# Patient Record
Sex: Female | Born: 1950 | Race: Black or African American | Hispanic: No | Smoking: Current every day smoker
Health system: Southern US, Community
[De-identification: ages and names within clinical notes are randomized; demographics above are authoritative.]

## PROBLEM LIST (undated history)

## (undated) DIAGNOSIS — I10 Essential (primary) hypertension: Secondary | ICD-10-CM

## (undated) DIAGNOSIS — E7800 Pure hypercholesterolemia, unspecified: Secondary | ICD-10-CM

## (undated) DIAGNOSIS — K219 Gastro-esophageal reflux disease without esophagitis: Secondary | ICD-10-CM

## (undated) DIAGNOSIS — M549 Dorsalgia, unspecified: Secondary | ICD-10-CM

## (undated) HISTORY — PX: CHOLECYSTECTOMY: SHX55

---

## 2013-01-19 ENCOUNTER — Other Ambulatory Visit: Payer: Self-pay | Admitting: Orthopaedic Surgery

## 2013-01-19 DIAGNOSIS — M545 Low back pain, unspecified: Secondary | ICD-10-CM

## 2013-09-21 ENCOUNTER — Other Ambulatory Visit: Payer: Self-pay | Admitting: Obstetrics and Gynecology

## 2013-09-21 DIAGNOSIS — N920 Excessive and frequent menstruation with regular cycle: Secondary | ICD-10-CM

## 2013-09-23 ENCOUNTER — Ambulatory Visit (HOSPITAL_BASED_OUTPATIENT_CLINIC_OR_DEPARTMENT_OTHER)
Admission: RE | Admit: 2013-09-23 | Discharge: 2013-09-23 | Disposition: A | Discharge status: Home / Self Care | Payer: Medicare PPO | Source: Ambulatory Visit | Attending: Obstetrics and Gynecology | Admitting: Obstetrics and Gynecology

## 2013-09-23 ENCOUNTER — Ambulatory Visit (HOSPITAL_BASED_OUTPATIENT_CLINIC_OR_DEPARTMENT_OTHER): Admission: RE | Admit: 2013-09-23 | Payer: Medicare PPO | Source: Ambulatory Visit

## 2013-09-23 ENCOUNTER — Ambulatory Visit (HOSPITAL_BASED_OUTPATIENT_CLINIC_OR_DEPARTMENT_OTHER): Payer: Medicare PPO

## 2013-09-23 DIAGNOSIS — N920 Excessive and frequent menstruation with regular cycle: Secondary | ICD-10-CM

## 2013-09-23 DIAGNOSIS — D259 Leiomyoma of uterus, unspecified: Secondary | ICD-10-CM | POA: Insufficient documentation

## 2014-09-10 ENCOUNTER — Encounter (HOSPITAL_BASED_OUTPATIENT_CLINIC_OR_DEPARTMENT_OTHER): Payer: Self-pay | Admitting: Emergency Medicine

## 2014-09-10 ENCOUNTER — Emergency Department (HOSPITAL_BASED_OUTPATIENT_CLINIC_OR_DEPARTMENT_OTHER)
Admission: EM | Admit: 2014-09-10 | Discharge: 2014-09-10 | Disposition: A | Discharge status: Home / Self Care | Payer: Medicare HMO | Attending: Emergency Medicine | Admitting: Emergency Medicine

## 2014-09-10 DIAGNOSIS — Z880 Allergy status to penicillin: Secondary | ICD-10-CM | POA: Diagnosis not present

## 2014-09-10 DIAGNOSIS — I10 Essential (primary) hypertension: Secondary | ICD-10-CM | POA: Diagnosis not present

## 2014-09-10 DIAGNOSIS — R001 Bradycardia, unspecified: Secondary | ICD-10-CM | POA: Diagnosis present

## 2014-09-10 DIAGNOSIS — Z720 Tobacco use: Secondary | ICD-10-CM | POA: Insufficient documentation

## 2014-09-10 DIAGNOSIS — Z79899 Other long term (current) drug therapy: Secondary | ICD-10-CM | POA: Diagnosis not present

## 2014-09-10 DIAGNOSIS — M545 Low back pain, unspecified: Secondary | ICD-10-CM

## 2014-09-10 DIAGNOSIS — E780 Pure hypercholesterolemia: Secondary | ICD-10-CM | POA: Diagnosis not present

## 2014-09-10 DIAGNOSIS — I493 Ventricular premature depolarization: Secondary | ICD-10-CM | POA: Insufficient documentation

## 2014-09-10 DIAGNOSIS — I499 Cardiac arrhythmia, unspecified: Secondary | ICD-10-CM

## 2014-09-10 DIAGNOSIS — K219 Gastro-esophageal reflux disease without esophagitis: Secondary | ICD-10-CM | POA: Diagnosis not present

## 2014-09-10 DIAGNOSIS — G8929 Other chronic pain: Secondary | ICD-10-CM | POA: Diagnosis not present

## 2014-09-10 DIAGNOSIS — Z7902 Long term (current) use of antithrombotics/antiplatelets: Secondary | ICD-10-CM | POA: Insufficient documentation

## 2014-09-10 DIAGNOSIS — I498 Other specified cardiac arrhythmias: Secondary | ICD-10-CM

## 2014-09-10 DIAGNOSIS — M542 Cervicalgia: Secondary | ICD-10-CM | POA: Insufficient documentation

## 2014-09-10 HISTORY — DX: Gastro-esophageal reflux disease without esophagitis: K21.9

## 2014-09-10 HISTORY — DX: Dorsalgia, unspecified: M54.9

## 2014-09-10 HISTORY — DX: Pure hypercholesterolemia, unspecified: E78.00

## 2014-09-10 HISTORY — DX: Essential (primary) hypertension: I10

## 2014-09-10 LAB — BASIC METABOLIC PANEL
ANION GAP: 10 (ref 5–15)
BUN: 18 mg/dL (ref 6–20)
CO2: 29 mmol/L (ref 22–32)
Calcium: 9.3 mg/dL (ref 8.9–10.3)
Chloride: 100 mmol/L — ABNORMAL LOW (ref 101–111)
Creatinine, Ser: 0.93 mg/dL (ref 0.44–1.00)
GLUCOSE: 91 mg/dL (ref 65–99)
POTASSIUM: 3.4 mmol/L — AB (ref 3.5–5.1)
SODIUM: 139 mmol/L (ref 135–145)

## 2014-09-10 LAB — CBC WITH DIFFERENTIAL/PLATELET
Basophils Absolute: 0 10*3/uL (ref 0.0–0.1)
Basophils Relative: 0 % (ref 0–1)
Eosinophils Absolute: 0.1 10*3/uL (ref 0.0–0.7)
Eosinophils Relative: 1 % (ref 0–5)
HEMATOCRIT: 48.3 % — AB (ref 36.0–46.0)
HEMOGLOBIN: 16.5 g/dL — AB (ref 12.0–15.0)
LYMPHS ABS: 2.1 10*3/uL (ref 0.7–4.0)
Lymphocytes Relative: 39 % (ref 12–46)
MCH: 28.7 pg (ref 26.0–34.0)
MCHC: 34.2 g/dL (ref 30.0–36.0)
MCV: 84 fL (ref 78.0–100.0)
MONO ABS: 0.6 10*3/uL (ref 0.1–1.0)
Monocytes Relative: 11 % (ref 3–12)
NEUTROS ABS: 2.5 10*3/uL (ref 1.7–7.7)
Neutrophils Relative %: 49 % (ref 43–77)
Platelets: 149 10*3/uL — ABNORMAL LOW (ref 150–400)
RBC: 5.75 MIL/uL — ABNORMAL HIGH (ref 3.87–5.11)
RDW: 14.4 % (ref 11.5–15.5)
WBC: 5.3 10*3/uL (ref 4.0–10.5)

## 2014-09-10 LAB — TROPONIN I: Troponin I: <0.03 ng/mL (ref <0.031)

## 2014-09-10 MED ORDER — OXYCODONE-ACETAMINOPHEN 10-325 MG PO TABS: 1.0000 | ORAL_TABLET | ORAL | Status: AC | PRN

## 2014-09-10 NOTE — ED Provider Notes (Signed)
CSN: [id number]     Arrival date & time 09/10/14  2006 History  This chart was scribed for Veryl Speak, MD by Delphia Grates, ED Scribe. This patient was seen in room MH09/MH09 and the patient's care was started at 8:57 PM.    Chief Complaint  Patient presents with  . Bradycardia  . Neck Pain    The history is provided by the patient. No language interpreter was used.     HPI Comments: Robin Garner is a 63 y.o. female who presents to the Emergency Department for further evaluation of possible bradycardia (HR at triage 38). Patient has history of chronic back pain and DDD that was being managed at a pain clinic. However, she states she recently missed an appointment for a pill count and was subsequently released as a patient. Patient has been out of pain medication for the past 3-4 days and went to Urgent Care this morning to be evaluated for her chronic pain where she had a questionable EKG and low heart rate. She was sent here for further evaluation. There is associated dizziness and nausea onset this morning. 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 She further denies BLE swelling.   Past Medical History  Diagnosis Date  . Back pain   . Hypertension   . High cholesterol   . Acid reflux    History reviewed. No pertinent past surgical history. History reviewed. No pertinent family history. History  Substance Use Topics  . Smoking status: Current Every Day Smoker  . Smokeless tobacco: Not on file  . Alcohol Use: Yes     Comment: 1 times per month   OB History    No data available     Review of Systems  A complete 10 system review of systems was obtained and all systems are negative except as noted in the HPI and PMH.    Allergies  Penicillins  Home Medications   Prior to Admission medications   Medication Sig Start Date End Date Taking? Authorizing Provider  buPROPion (WELLBUTRIN) 100 MG tablet Take 100 mg  by mouth 2 (two) times daily.   Yes Historical Provider, MD  carvedilol (COREG) 25 MG tablet Take 25 mg by mouth 2 (two) times daily with a meal.   Yes Historical Provider, MD  clopidogrel (PLAVIX) 75 MG tablet Take 75 mg by mouth daily.   Yes Historical Provider, MD  esomeprazole (NEXIUM) 40 MG capsule Take 40 mg by mouth daily at 12 noon.   Yes Historical Provider, MD  gabapentin (NEURONTIN) 600 MG tablet Take 600 mg by mouth 3 (three) times daily.   Yes Historical Provider, MD  losartan (COZAAR) 50 MG tablet Take 50 mg by mouth daily.   Yes Historical Provider, MD  lubiprostone (AMITIZA) 24 MCG capsule Take 24 mcg by mouth 2 (two) times daily with a meal.   Yes Historical Provider, MD  metoCLOPramide (REGLAN) 10 MG tablet Take 10 mg by mouth 4 (four) times daily.   Yes Historical Provider, MD  norgestimate-ethinyl estradiol (ORTHO-CYCLEN,SPRINTEC,PREVIFEM) 0.25-35 MG-MCG tablet Take 1 tablet by mouth daily.   Yes Historical Provider, MD  oxybutynin (DITROPAN) 5 MG tablet Take 5 mg by mouth 3 (three) times daily.   Yes Historical Provider, MD  oxyCODONE (ROXICODONE) 15 MG immediate release tablet Take 15 mg by mouth every 4 (four) hours as needed for pain.   Yes Historical Provider, MD  simvastatin (ZOCOR) 10 MG tablet Take  10 mg by mouth daily.   Yes Historical Provider, MD  sucralfate (CARAFATE) 1 G tablet Take 1 g by mouth 4 (four) times daily -  with meals and at bedtime.   Yes Historical Provider, MD  tiZANidine (ZANAFLEX) 4 MG tablet Take 4 mg by mouth every 6 (six) hours as needed for muscle spasms.   Yes Historical Provider, MD  traZODone (DESYREL) 50 MG tablet Take 50 mg by mouth at bedtime.   Yes Historical Provider, MD   Triage Vitals: BP 157/72 mmHg  Pulse 38  Temp(Src) 98.1 F (36.7 C) (Oral)  Ht 5\' 6"  (1.676 m)  Wt 170 lb 6 oz (77.282 kg)  BMI 27.51 kg/m2  SpO2 98%  LMP 09/07/2014  Physical Exam  Constitutional: She is oriented to person, place, and time. She appears  well-developed and well-nourished. No distress.  HENT:  Head: Normocephalic and atraumatic.  Eyes: Conjunctivae and EOM are normal.  Neck: Neck supple. No tracheal deviation present.  Cardiovascular: Normal rate, regular rhythm and normal heart sounds.   Pulmonary/Chest: Effort normal and breath sounds normal. No respiratory distress.  Abdominal: Soft.  Musculoskeletal: Normal range of motion.  Neurological: She is alert and oriented to person, place, and time.  Skin: Skin is warm and dry.  Psychiatric: She has a normal mood and affect. Her behavior is normal.  Nursing note and vitals reviewed.   ED Course  Procedures (including critical care time)  DIAGNOSTIC STUDIES: Oxygen Saturation is 98% on room air, normal by my interpretation.    COORDINATION OF CARE: At 2101 Discussed treatment plan with patient which includes labs. Patient agrees.   Labs Review Labs Reviewed  CBC WITH DIFFERENTIAL/PLATELET - Abnormal; Notable for the following:    RBC 5.75 (*)    Hemoglobin 16.5 (*)    HCT 48.3 (*)    Platelets 149 (*)    All other components within normal limits  BASIC METABOLIC PANEL  TROPONIN I    Imaging Review No results found.   EKG Interpretation   Date/Time:  Friday September 10 2014 20:20:47 EDT Ventricular Rate:  71 PR Interval:  166 QRS Duration: 94 QT Interval:  402 QTC Calculation: 436 R Axis:   47 Text Interpretation:  Sinus rhythm with frequent Premature ventricular  complexes in a pattern of bigeminy Left atrial enlargement T wave  abnormality, consider lateral ischemia Abnormal ECG Confirmed by Cenia Zaragosa  MD,  Lanya Bucks ([id number]) on 09/10/2014 8:36:00 PM      MDM   Final diagnoses:  None    Patient presents here with complaints of irregular heartbeat. She has a history of chronic back pain. She tells me that she missed a "pill count" with her pain management specialist and was removed from the practice. She is now out of her pain medication and went to urgent  care today hoping for a refill. While she was there, her heart rate was low and she was sent here for evaluation of this.  Her EKG here reveals frequent PVCs in a bigeminal pattern, however she appears very stable otherwise. Her blood work including CBC, metabolic profile, and troponin are all unremarkable. I do not feel as though any further workup is indicated into the cardiac issues. I have agreed to prescribe a limited quantity of her pain medication until she can make arrangements for a new doctor.  I personally performed the services described in this documentation, which was scribed in my presence. The recorded information has been reviewed and is accurate.  Veryl Speak, MD 09/10/14 [id number]

## 2014-09-10 NOTE — Discharge Instructions (Signed)
Percocet as prescribed.  Follow-up with your primary Dr. to discuss referrals to a pain management specialist.   Chronic Pain Chronic pain can be defined as pain that is off and on and lasts for 3-6 months or longer. Many things cause chronic pain, which can make it difficult to make a diagnosis. There are many treatment options available for chronic pain. However, finding a treatment that works well for you may require trying various approaches until the right one is found. Many people benefit from a combination of two or more types of treatment to control their pain. SYMPTOMS  Chronic pain can occur anywhere in the body and can range from mild to very severe. Some types of chronic pain include:  Headache.  Low back pain.  Cancer pain.  Arthritis pain.  Neurogenic pain. This is pain resulting from damage to nerves. People with chronic pain may also have other symptoms such as:  Depression.  Anger.  Insomnia.  Anxiety. DIAGNOSIS  Your health care provider will help diagnose your condition over time. In many cases, the initial focus will be on excluding possible conditions that could be causing the pain. Depending on your symptoms, your health care provider may order tests to diagnose your condition. Some of these tests may include:   Blood tests.   CT scan.   MRI.   X-rays.   Ultrasounds.   Nerve conduction studies.  You may need to see a specialist.  TREATMENT  Finding treatment that works well may take time. You may be referred to a pain specialist. He or she may prescribe medicine or therapies, such as:   Mindful meditation or yoga.  Shots (injections) of numbing or pain-relieving medicines into the spine or area of pain.  Local electrical stimulation.  Acupuncture.   Massage therapy.   Aroma, color, light, or sound therapy.   Biofeedback.   Working with a physical therapist to keep from getting stiff.   Regular, gentle exercise.    Cognitive or behavioral therapy.   Group support.  Sometimes, surgery may be recommended.  HOME CARE INSTRUCTIONS   Take all medicines as directed by your health care provider.   Lessen stress in your life by relaxing and doing things such as listening to calming music.   Exercise or be active as directed by your health care provider.   Eat a healthy diet and include things such as vegetables, fruits, fish, and lean meats in your diet.   Keep all follow-up appointments with your health care provider.   Attend a support group with others suffering from chronic pain. SEEK MEDICAL CARE IF:   Your pain gets worse.   You develop a new pain that was not there before.   You cannot tolerate medicines given to you by your health care provider.   You have new symptoms since your last visit with your health care provider.  SEEK IMMEDIATE MEDICAL CARE IF:   You feel weak.   You have decreased sensation or numbness.   You lose control of bowel or bladder function.   Your pain suddenly gets much worse.   You develop shaking.  You develop chills.  You develop confusion.  You develop chest pain.  You develop shortness of breath.  MAKE SURE YOU:  Understand these instructions.  Will watch your condition.  Will get help right away if you are not doing well or get worse. Document Released: 12/16/2001 Document Revised: 11/26/2012 Document Reviewed: 09/19/2012 [facility] Patient Information 2015 [place], [place]. This information  is not intended to replace advice given to you by your health care provider. Make sure you discuss any questions you have with your health care provider.  Premature Ventricular Contraction Premature ventricular contraction (PVC) is an irregularity of the heart rhythm involving extra or skipped heartbeats. In some cases, they may occur without obvious cause or heart disease. Other times, they can be caused by an electrolyte change in the  blood. These need to be corrected. They can also be seen when there is not enough oxygen going to the heart. A common cause of this is plaque or cholesterol buildup. This buildup decreases the blood supply to the heart. In addition, extra beats may be caused or aggravated by:  Excessive smoking.  Alcohol consumption.  Caffeine.  Certain medications  Some street drugs. SYMPTOMS   The sensation of feeling your heart skipping a beat (palpitations).  In many cases, the person may have no symptoms. SIGNS AND TESTS   A physical examination may show an occasional irregularity, but if the PVC beats do not happen often, they may not be found on physical exam.  Blood pressure is usually normal.  Other tests that may find extra beats of the heart are:  An EKG (electrocardiogram)  A Holter monitor which can monitor your heart over longer periods of time  An Angiogram (study of the heart arteries). TREATMENT  Usually extra heartbeats do not need treatment. The condition is treated only if symptoms are severe or if extra beats are very frequent or are causing problems. An underlying cause, if discovered, may also require treatment.  Treatment may also be needed if there may be a risk for other more serious cardiac arrhythmias.  PREVENTION   Moderation in caffeine, alcohol, and tobacco use may reduce the risk of ectopic heartbeats in some people.  Exercise often helps people who lead a sedentary (inactive) lifestyle. PROGNOSIS  PVC heartbeats are generally harmless and do not need treatment.  RISKS AND COMPLICATIONS   Ventricular tachycardia (occasionally).  There usually are no complications.  Other arrhythmias (occasionally). SEEK IMMEDIATE MEDICAL CARE IF:   You feel palpitations that are frequent or continual.  You develop chest pain or other problems such as shortness of breath, sweating, or nausea and vomiting.  You become light-headed or faint (pass out).  You get worse  or do not improve with treatment. Document Released: 11/11/2003 Document Revised: 06/18/2011 Document Reviewed: 05/23/2007 [facility] Patient Information 2015 [place], [place]. This information is not intended to replace advice given to you by your health care provider. Make sure you discuss any questions you have with your health care provider.

## 2014-09-10 NOTE — ED Notes (Signed)
Patient has a history of neck and back pain, the patient reports that she went to the Urgent care to be evaluated because she has not had any pain meds, the patient reports that her pain MD did not go to the last pain medication check and her Pain dr would not give her a refill. The patient reports that she has not been referred to a new pain MD - the patient reports that the pain became unbearable. Incidental finding was that she was brady cardic. The patient reports that she was Dizzy and nauseated this am. The patient reports that she has a 70 % blockage to right carotid artery

## 2014-09-10 NOTE — ED Notes (Signed)
MD at bedside. 

## 2014-10-07 ENCOUNTER — Emergency Department (HOSPITAL_BASED_OUTPATIENT_CLINIC_OR_DEPARTMENT_OTHER): Payer: Medicare HMO

## 2014-10-07 ENCOUNTER — Emergency Department (HOSPITAL_BASED_OUTPATIENT_CLINIC_OR_DEPARTMENT_OTHER)
Admission: EM | Admit: 2014-10-07 | Discharge: 2014-10-07 | Disposition: A | Discharge status: Home / Self Care | Payer: Medicare HMO | Attending: Emergency Medicine | Admitting: Emergency Medicine

## 2014-10-07 ENCOUNTER — Encounter (HOSPITAL_BASED_OUTPATIENT_CLINIC_OR_DEPARTMENT_OTHER): Payer: Self-pay | Admitting: *Deleted

## 2014-10-07 DIAGNOSIS — I10 Essential (primary) hypertension: Secondary | ICD-10-CM | POA: Diagnosis not present

## 2014-10-07 DIAGNOSIS — Z793 Long term (current) use of hormonal contraceptives: Secondary | ICD-10-CM | POA: Diagnosis not present

## 2014-10-07 DIAGNOSIS — Y92002 Bathroom of unspecified non-institutional (private) residence single-family (private) house as the place of occurrence of the external cause: Secondary | ICD-10-CM | POA: Insufficient documentation

## 2014-10-07 DIAGNOSIS — S79912A Unspecified injury of left hip, initial encounter: Secondary | ICD-10-CM | POA: Insufficient documentation

## 2014-10-07 DIAGNOSIS — Z880 Allergy status to penicillin: Secondary | ICD-10-CM | POA: Insufficient documentation

## 2014-10-07 DIAGNOSIS — S0990XA Unspecified injury of head, initial encounter: Secondary | ICD-10-CM | POA: Diagnosis present

## 2014-10-07 DIAGNOSIS — S4991XA Unspecified injury of right shoulder and upper arm, initial encounter: Secondary | ICD-10-CM | POA: Insufficient documentation

## 2014-10-07 DIAGNOSIS — S161XXA Strain of muscle, fascia and tendon at neck level, initial encounter: Secondary | ICD-10-CM | POA: Diagnosis not present

## 2014-10-07 DIAGNOSIS — Z79899 Other long term (current) drug therapy: Secondary | ICD-10-CM | POA: Insufficient documentation

## 2014-10-07 DIAGNOSIS — Y998 Other external cause status: Secondary | ICD-10-CM | POA: Insufficient documentation

## 2014-10-07 DIAGNOSIS — Z720 Tobacco use: Secondary | ICD-10-CM | POA: Diagnosis not present

## 2014-10-07 DIAGNOSIS — E780 Pure hypercholesterolemia: Secondary | ICD-10-CM | POA: Diagnosis not present

## 2014-10-07 DIAGNOSIS — M25552 Pain in left hip: Secondary | ICD-10-CM

## 2014-10-07 DIAGNOSIS — Y9389 Activity, other specified: Secondary | ICD-10-CM | POA: Diagnosis not present

## 2014-10-07 DIAGNOSIS — W19XXXA Unspecified fall, initial encounter: Secondary | ICD-10-CM

## 2014-10-07 DIAGNOSIS — S0181XA Laceration without foreign body of other part of head, initial encounter: Secondary | ICD-10-CM | POA: Diagnosis not present

## 2014-10-07 DIAGNOSIS — Z7902 Long term (current) use of antithrombotics/antiplatelets: Secondary | ICD-10-CM | POA: Insufficient documentation

## 2014-10-07 DIAGNOSIS — K219 Gastro-esophageal reflux disease without esophagitis: Secondary | ICD-10-CM | POA: Diagnosis not present

## 2014-10-07 DIAGNOSIS — W01198A Fall on same level from slipping, tripping and stumbling with subsequent striking against other object, initial encounter: Secondary | ICD-10-CM | POA: Diagnosis not present

## 2014-10-07 DIAGNOSIS — Z23 Encounter for immunization: Secondary | ICD-10-CM | POA: Insufficient documentation

## 2014-10-07 DIAGNOSIS — W010XXA Fall on same level from slipping, tripping and stumbling without subsequent striking against object, initial encounter: Secondary | ICD-10-CM

## 2014-10-07 MED ORDER — HYDROCODONE-ACETAMINOPHEN 5-325 MG PO TABS: 1.0000 | ORAL_TABLET | ORAL | Status: AC | PRN

## 2014-10-07 MED ORDER — TETANUS-DIPHTH-ACELL PERTUSSIS 5-2.5-18.5 LF-MCG/0.5 IM SUSP
0.5000 mL | Freq: Once | INTRAMUSCULAR | Status: AC
  Administered 2014-10-07: 0.5 mL via INTRAMUSCULAR
  Filled 2014-10-07: qty 0.5

## 2014-10-07 MED ORDER — OXYCODONE-ACETAMINOPHEN 5-325 MG PO TABS
2.0000 | ORAL_TABLET | Freq: Once | ORAL | Status: AC
  Administered 2014-10-07: 2 via ORAL
  Filled 2014-10-07: qty 2

## 2014-10-07 NOTE — ED Provider Notes (Signed)
CSN: [id number]     Arrival date & time 10/07/14  1138 History   First MD Initiated Contact with Patient 10/07/14 1206     Chief Complaint  Patient presents with  . Fall  . Facial Laceration     (Consider location/radiation/quality/duration/timing/severity/associated sxs/prior Treatment) HPI Comments: 64 year old female presenting with a laceration to the left side of her forehead occurring after slipping on baby powder causing her to hit her head on the bathtub just prior to arrival. No loss of consciousness. Currently she is complaining of neck pain and left hip pain. Pain 9.5/10. No aggravating or alleviating factors. No medications tried. Denies dizziness, lightheadedness, vision change, numbness or tingling down extremities, loss of control bowels or bladder saddle anesthesia. Denies back pain, abdominal pain or chest pain. Takes Plavix.  Patient is a 64 y.o. female presenting with fall. The history is provided by the patient.  Fall Associated symptoms include neck pain.    Past Medical History  Diagnosis Date  . Back pain   . Hypertension   . High cholesterol   . Acid reflux    Past Surgical History  Procedure Laterality Date  . Cholecystectomy     No family history on file. History  Substance Use Topics  . Smoking status: Current Every Day Smoker    Types: Cigarettes  . Smokeless tobacco: Never Used  . Alcohol Use: Yes     Comment: occasional   OB History    No data available     Review of Systems  Musculoskeletal: Positive for neck pain.       + L hip pain.  Skin: Positive for wound.  All other systems reviewed and are negative.     Allergies  Penicillins  Home Medications   Prior to Admission medications   Medication Sig Start Date End Date Taking? Authorizing Provider  buPROPion (WELLBUTRIN) 100 MG tablet Take 100 mg by mouth 2 (two) times daily.   Yes Historical Provider, MD  carvedilol (COREG) 25 MG tablet Take 25 mg by mouth 2 (two) times daily  with a meal.   Yes Historical Provider, MD  clopidogrel (PLAVIX) 75 MG tablet Take 75 mg by mouth daily.   Yes Historical Provider, MD  esomeprazole (NEXIUM) 40 MG capsule Take 40 mg by mouth daily at 12 noon.   Yes Historical Provider, MD  gabapentin (NEURONTIN) 600 MG tablet Take 600 mg by mouth 3 (three) times daily.   Yes Historical Provider, MD  losartan (COZAAR) 50 MG tablet Take 50 mg by mouth daily.   Yes Historical Provider, MD  lubiprostone (AMITIZA) 24 MCG capsule Take 24 mcg by mouth 2 (two) times daily with a meal.   Yes Historical Provider, MD  metoCLOPramide (REGLAN) 10 MG tablet Take 10 mg by mouth 4 (four) times daily.   Yes Historical Provider, MD  oxybutynin (DITROPAN) 5 MG tablet Take 5 mg by mouth 3 (three) times daily.   Yes Historical Provider, MD  oxyCODONE (ROXICODONE) 15 MG immediate release tablet Take 15 mg by mouth every 4 (four) hours as needed for pain.   Yes Historical Provider, MD  simvastatin (ZOCOR) 10 MG tablet Take 10 mg by mouth daily.   Yes Historical Provider, MD  sucralfate (CARAFATE) 1 G tablet Take 1 g by mouth 4 (four) times daily -  with meals and at bedtime.   Yes Historical Provider, MD  tiZANidine (ZANAFLEX) 4 MG tablet Take 4 mg by mouth every 6 (six) hours as needed for muscle spasms.  Yes Historical Provider, MD  traZODone (DESYREL) 50 MG tablet Take 50 mg by mouth at bedtime.   Yes Historical Provider, MD  HYDROcodone-acetaminophen (NORCO/VICODIN) 5-325 MG per tablet Take 1-2 tablets by mouth every 4 (four) hours as needed. 10/07/14   Carman Ching, PA-C  norgestimate-ethinyl estradiol (ORTHO-CYCLEN,SPRINTEC,PREVIFEM) 0.25-35 MG-MCG tablet Take 1 tablet by mouth daily.    Historical Provider, MD  oxyCODONE-acetaminophen (PERCOCET) 10-325 MG per tablet Take 1 tablet by mouth every 4 (four) hours as needed for pain. 09/10/14   Veryl Speak, MD   BP 156/70 mmHg  Pulse 68  Temp(Src) 98.3 F (36.8 C) (Oral)  Resp 18  Ht 5\' 6"  (1.676 m)  Wt 175 lb  (79.379 kg)  BMI 28.26 kg/m2  SpO2 98%  LMP 09/08/2014 Physical Exam  Constitutional: She is oriented to person, place, and time. She appears well-developed and well-nourished. No distress.  HENT:  Head: Normocephalic. Head is without Battle's sign.    Mouth/Throat: Oropharynx is clear and moist.  Small amount of swelling below left eyebrow. Tenderness to left maxilla and left eyebrow. No crepitus or step-off. No hemotympanum.  Eyes: Conjunctivae and EOM are normal. Pupils are equal, round, and reactive to light.  Neck: Normal range of motion. Neck supple.  Cardiovascular: Normal rate, regular rhythm, normal heart sounds and intact distal pulses.   Pulses:      Posterior tibial pulses are 2+ on the right side, and 2+ on the left side.  Pulmonary/Chest: Effort normal and breath sounds normal. No respiratory distress.  Abdominal: Soft. Bowel sounds are normal. There is no tenderness.  Musculoskeletal: Normal range of motion. She exhibits no edema.  No C-spine tenderness. TTP right cervical paraspinal muscles and right trapezius. Left hip TTP laterally over greater trochanter. No deformity, rotation or shortening. Full range of motion, pain with external rotation.  Neurological: She is alert and oriented to person, place, and time. She has normal strength. No cranial nerve deficit or sensory deficit. Coordination and gait normal. GCS eye subscore is 4. GCS verbal subscore is 5. GCS motor subscore is 6.  Speech fluent, goal oriented. Moves all extremities without ataxia.  Skin: Skin is warm and dry. No rash noted. She is not diaphoretic.  Psychiatric: She has a normal mood and affect. Her behavior is normal.  Nursing note and vitals reviewed.   ED Course  Procedures (including critical care time) Labs Review Labs Reviewed - No data to display  Imaging Review Ct Head Wo Contrast  10/07/2014   CLINICAL DATA:  Pain following fall  EXAM: CT HEAD WITHOUT CONTRAST  CT MAXILLOFACIAL  WITHOUT CONTRAST  CT CERVICAL SPINE WITHOUT CONTRAST  TECHNIQUE: Multidetector CT imaging of the head, cervical spine, and maxillofacial structures were performed using the standard protocol without intravenous contrast. Multiplanar CT image reconstructions of the cervical spine and maxillofacial structures were also generated.  COMPARISON:  None.  FINDINGS: CT HEAD FINDINGS  The ventricles are normal in size and configuration. There is no intracranial mass, hemorrhage, extra-axial fluid collection, or midline shift. The gray-white compartments are normal. There is no acute infarct. There is a left lower frontal scalp hematoma. The bony calvarium appears intact. The mastoid air cells are clear.  CT MAXILLOFACIAL FINDINGS  There is no demonstrable fracture or dislocation. Orbits appear symmetric and normal bilaterally. There are no intraorbital lesions. There is a lower left frontal scalp hematoma without underlying fracture.  There is opacification in the right sphenoid sinus. There is minimal mucosal thickening in the  inferior left maxillary sinus. Other paranasal sinuses are clear. Ostiomeatal unit complexes are patent bilaterally. There is edema of the right inferior nasal turbinate with narrowing of the right naris but no nares obstruction. There are no air-fluid levels. No bony destruction or expansion. Nasal septum is virtually midline.  Salivary glands appear normal.  No adenopathy appreciable.  CT CERVICAL SPINE FINDINGS  There is no acute fracture or spondylolisthesis. There are well corticated areas of bone just posterior to the C7 and T1 spinous processes, possibly residua of old trauma. Prevertebral soft tissues and predental space regions are normal.  There are large bridging anterior osteophytes at C5 and C6. There is also a large osteophyte arising anteriorly at C4. There is moderate disc space narrowing at C3-4 and C5-6. There is slightly less narrowing at C4-5. There is facet hypertrophy at most  levels bilaterally. There is exit foraminal narrowing which is moderately severe at C3-4 bilaterally, at C4-5 on the right, and at C5-6 on the left. There is a calcified left paracentral disc protrusion at C4-5. A similar disc protrusion on the left paracentrally is noted at C5-6. There is no frank stenosis.  Calcification is noted in each carotid artery. Thyroid appears inhomogeneous on the right with subcentimeter nodular opacities in the right thyroid. Visualized lung apices are clear.  IMPRESSION: CT head: Left frontal scalp hematoma. No intracranial mass, hemorrhage, or extra-axial fluid collection. Gray-white compartments are normal; no acute infarct evident.  CT maxillofacial: Left lower frontal scalp hematoma. No fracture or dislocation. Right sphenoid sinus disease. Other paranasal sinuses are clear. Ostiomeatal unit complexes are patent bilaterally. Mild turbinate edema on the right with narrowing but no obstruction of the right naris. Left naris widely patent.  CT cervical spine: No acute fracture or spondylolisthesis. Question residua of old trauma along the posterior most aspects of the C7 and T1 spinous processes.  Multifocal osteoarthritic change. Calcified paracentral disc protrusions on the left at C4-5 and C5-6.  Multinodular goiter in the right thyroid lobe. Bilateral carotid artery calcification.   Electronically Signed   By: Lowella Grip III M.D.   On: 10/07/2014 13:04   Ct Cervical Spine Wo Contrast  10/07/2014   CLINICAL DATA:  Pain following fall  EXAM: CT HEAD WITHOUT CONTRAST  CT MAXILLOFACIAL WITHOUT CONTRAST  CT CERVICAL SPINE WITHOUT CONTRAST  TECHNIQUE: Multidetector CT imaging of the head, cervical spine, and maxillofacial structures were performed using the standard protocol without intravenous contrast. Multiplanar CT image reconstructions of the cervical spine and maxillofacial structures were also generated.  COMPARISON:  None.  FINDINGS: CT HEAD FINDINGS  The ventricles  are normal in size and configuration. There is no intracranial mass, hemorrhage, extra-axial fluid collection, or midline shift. The gray-white compartments are normal. There is no acute infarct. There is a left lower frontal scalp hematoma. The bony calvarium appears intact. The mastoid air cells are clear.  CT MAXILLOFACIAL FINDINGS  There is no demonstrable fracture or dislocation. Orbits appear symmetric and normal bilaterally. There are no intraorbital lesions. There is a lower left frontal scalp hematoma without underlying fracture.  There is opacification in the right sphenoid sinus. There is minimal mucosal thickening in the inferior left maxillary sinus. Other paranasal sinuses are clear. Ostiomeatal unit complexes are patent bilaterally. There is edema of the right inferior nasal turbinate with narrowing of the right naris but no nares obstruction. There are no air-fluid levels. No bony destruction or expansion. Nasal septum is virtually midline.  Salivary glands appear normal.  No adenopathy appreciable.  CT CERVICAL SPINE FINDINGS  There is no acute fracture or spondylolisthesis. There are well corticated areas of bone just posterior to the C7 and T1 spinous processes, possibly residua of old trauma. Prevertebral soft tissues and predental space regions are normal.  There are large bridging anterior osteophytes at C5 and C6. There is also a large osteophyte arising anteriorly at C4. There is moderate disc space narrowing at C3-4 and C5-6. There is slightly less narrowing at C4-5. There is facet hypertrophy at most levels bilaterally. There is exit foraminal narrowing which is moderately severe at C3-4 bilaterally, at C4-5 on the right, and at C5-6 on the left. There is a calcified left paracentral disc protrusion at C4-5. A similar disc protrusion on the left paracentrally is noted at C5-6. There is no frank stenosis.  Calcification is noted in each carotid artery. Thyroid appears inhomogeneous on the  right with subcentimeter nodular opacities in the right thyroid. Visualized lung apices are clear.  IMPRESSION: CT head: Left frontal scalp hematoma. No intracranial mass, hemorrhage, or extra-axial fluid collection. Gray-white compartments are normal; no acute infarct evident.  CT maxillofacial: Left lower frontal scalp hematoma. No fracture or dislocation. Right sphenoid sinus disease. Other paranasal sinuses are clear. Ostiomeatal unit complexes are patent bilaterally. Mild turbinate edema on the right with narrowing but no obstruction of the right naris. Left naris widely patent.  CT cervical spine: No acute fracture or spondylolisthesis. Question residua of old trauma along the posterior most aspects of the C7 and T1 spinous processes.  Multifocal osteoarthritic change. Calcified paracentral disc protrusions on the left at C4-5 and C5-6.  Multinodular goiter in the right thyroid lobe. Bilateral carotid artery calcification.   Electronically Signed   By: Lowella Grip III M.D.   On: 10/07/2014 13:04   Dg Hip Unilat With Pelvis 2-3 Views Left  10/07/2014   CLINICAL DATA:  64 year old female with a history of pain and fall  EXAM: LEFT HIP (WITH PELVIS) 2-3 VIEWS  COMPARISON:  None.  FINDINGS: Bony pelvic ring is intact.  No acute fracture identified.  Bilateral hips projects normally over the acetabula. Visualized aspects of proximal femurs unremarkable. Left hip appears congruent.  Changes of bilateral hip osteoarthritis.  Rounded eggshell calcification of the right lower quadrant, of uncertain location. This measures approximately 3.3 cm, with no comparisons available.  Pelvic phleboliths.  Degenerative changes of the spine.  IMPRESSION: No evidence of acute bony abnormality.  Bilateral hip osteoarthritis.  Rounded eggshell calcification measuring 3.3 cm of the right lower quadrant, of uncertain etiology. No prior studies are available for comparison. Potentially this could be located within the fecal  stream, or could be located intraperitoneal or extraperitoneal.  Signed,  Dulcy Fanny. Earleen Newport, DO  [facility]  [facility]   Electronically Signed   By: Corrie Mckusick D.O.   On: 10/07/2014 12:54   Ct Maxillofacial Wo Cm  10/07/2014   CLINICAL DATA:  Pain following fall  EXAM: CT HEAD WITHOUT CONTRAST  CT MAXILLOFACIAL WITHOUT CONTRAST  CT CERVICAL SPINE WITHOUT CONTRAST  TECHNIQUE: Multidetector CT imaging of the head, cervical spine, and maxillofacial structures were performed using the standard protocol without intravenous contrast. Multiplanar CT image reconstructions of the cervical spine and maxillofacial structures were also generated.  COMPARISON:  None.  FINDINGS: CT HEAD FINDINGS  The ventricles are normal in size and configuration. There is no intracranial mass, hemorrhage, extra-axial fluid collection, or midline shift. The gray-white compartments are  normal. There is no acute infarct. There is a left lower frontal scalp hematoma. The bony calvarium appears intact. The mastoid air cells are clear.  CT MAXILLOFACIAL FINDINGS  There is no demonstrable fracture or dislocation. Orbits appear symmetric and normal bilaterally. There are no intraorbital lesions. There is a lower left frontal scalp hematoma without underlying fracture.  There is opacification in the right sphenoid sinus. There is minimal mucosal thickening in the inferior left maxillary sinus. Other paranasal sinuses are clear. Ostiomeatal unit complexes are patent bilaterally. There is edema of the right inferior nasal turbinate with narrowing of the right naris but no nares obstruction. There are no air-fluid levels. No bony destruction or expansion. Nasal septum is virtually midline.  Salivary glands appear normal.  No adenopathy appreciable.  CT CERVICAL SPINE FINDINGS  There is no acute fracture or spondylolisthesis. There are well corticated areas of bone just posterior to the C7 and T1 spinous  processes, possibly residua of old trauma. Prevertebral soft tissues and predental space regions are normal.  There are large bridging anterior osteophytes at C5 and C6. There is also a large osteophyte arising anteriorly at C4. There is moderate disc space narrowing at C3-4 and C5-6. There is slightly less narrowing at C4-5. There is facet hypertrophy at most levels bilaterally. There is exit foraminal narrowing which is moderately severe at C3-4 bilaterally, at C4-5 on the right, and at C5-6 on the left. There is a calcified left paracentral disc protrusion at C4-5. A similar disc protrusion on the left paracentrally is noted at C5-6. There is no frank stenosis.  Calcification is noted in each carotid artery. Thyroid appears inhomogeneous on the right with subcentimeter nodular opacities in the right thyroid. Visualized lung apices are clear.  IMPRESSION: CT head: Left frontal scalp hematoma. No intracranial mass, hemorrhage, or extra-axial fluid collection. Gray-white compartments are normal; no acute infarct evident.  CT maxillofacial: Left lower frontal scalp hematoma. No fracture or dislocation. Right sphenoid sinus disease. Other paranasal sinuses are clear. Ostiomeatal unit complexes are patent bilaterally. Mild turbinate edema on the right with narrowing but no obstruction of the right naris. Left naris widely patent.  CT cervical spine: No acute fracture or spondylolisthesis. Question residua of old trauma along the posterior most aspects of the C7 and T1 spinous processes.  Multifocal osteoarthritic change. Calcified paracentral disc protrusions on the left at C4-5 and C5-6.  Multinodular goiter in the right thyroid lobe. Bilateral carotid artery calcification.   Electronically Signed   By: Lowella Grip III M.D.   On: 10/07/2014 13:04     EKG Interpretation None      MDM   Final diagnoses:  Fall from slip, trip, or stumble, initial encounter  Forehead laceration, initial encounter  Left  hip pain  Neck strain, initial encounter   Nontoxic appearing, NAD. AF VSS. No focal neurologic deficits. Imaging studies without any acute finding. C-collar removed. Patient able to perform full cervical range of motion without pain. Laceration repaired with Dermabond. Ambulates without difficulty. Head injury precautions discussed. Advised rest and ice. Vicodin for pain. Follow-up with PCP. She has an appointment in 5 days. Stable for discharge. Return precautions given. Patient states understanding of treatment care plan and is agreeable.  Carman Ching, PA-C 10/07/14 [place], MD 10/07/14 [phone]

## 2014-10-07 NOTE — Discharge Instructions (Signed)
Rest, apply ice to the areas you are sore. Take Vicodin for severe pain only. No driving or operating heavy machinery while taking vicodin. This medication may cause drowsiness. Follow up with your primary care doctor.  Cervical Sprain A cervical sprain is an injury in the neck in which the strong, fibrous tissues (ligaments) that connect your neck bones stretch or tear. Cervical sprains can range from mild to severe. Severe cervical sprains can cause the neck vertebrae to be unstable. This can lead to damage of the spinal cord and can result in serious nervous system problems. The amount of time it takes for a cervical sprain to get better depends on the cause and extent of the injury. Most cervical sprains heal in 1 to 3 weeks. CAUSES  Severe cervical sprains may be caused by:   Contact sport injuries (such as from football, rugby, wrestling, hockey, auto racing, gymnastics, diving, martial arts, or boxing).   Motor vehicle collisions.   Whiplash injuries. This is an injury from a sudden forward and backward whipping movement of the head and neck.  Falls.  Mild cervical sprains may be caused by:   Being in an awkward position, such as while cradling a telephone between your ear and shoulder.   Sitting in a chair that does not offer proper support.   Working at a poorly Landscape [employer] station.   Looking up or down for long periods of time.  SYMPTOMS   Pain, soreness, stiffness, or a burning sensation in the front, back, or sides of the neck. This discomfort may develop immediately after the injury or slowly, 24 hours or more after the injury.   Pain or tenderness directly in the middle of the back of the neck.   Shoulder or upper back pain.   Limited ability to move the neck.   Headache.   Dizziness.   Weakness, numbness, or tingling in the hands or arms.   Muscle spasms.   Difficulty swallowing or chewing.   Tenderness and swelling of the neck.   DIAGNOSIS  Most of the time your health care provider can diagnose a cervical sprain by taking your history and doing a physical exam. Your health care provider will ask about previous neck injuries and any known neck problems, such as arthritis in the neck. X-rays may be taken to find out if there are any other problems, such as with the bones of the neck. Other tests, such as a CT scan or MRI, may also be needed.  TREATMENT  Treatment depends on the severity of the cervical sprain. Mild sprains can be treated with rest, keeping the neck in place (immobilization), and pain medicines. Severe cervical sprains are immediately immobilized. Further treatment is done to help with pain, muscle spasms, and other symptoms and may include:  Medicines, such as pain relievers, numbing medicines, or muscle relaxants.   Physical therapy. This may involve stretching exercises, strengthening exercises, and posture training. Exercises and improved posture can help stabilize the neck, strengthen muscles, and help stop symptoms from returning.  HOME CARE INSTRUCTIONS   Put ice on the injured area.   Put ice in a plastic bag.   Place a towel between your skin and the bag.   Leave the ice on for 15-20 minutes, 3-4 times a day.   If your injury was severe, you may have been given a cervical collar to wear. A cervical collar is a two-piece collar designed to keep your neck from moving while it heals.  Do  not remove the collar unless instructed by your health care provider.  If you have long hair, keep it outside of the collar.  Ask your health care provider before making any adjustments to your collar. Minor adjustments may be required over time to improve comfort and reduce pressure on your chin or on the back of your head.  Ifyou are allowed to remove the collar for cleaning or bathing, follow your health care provider's instructions on how to do so safely.  Keep your collar clean by wiping it with  mild soap and water and drying it completely. If the collar you have been given includes removable pads, remove them every 1-2 days and hand wash them with soap and water. Allow them to air dry. They should be completely dry before you wear them in the collar.  If you are allowed to remove the collar for cleaning and bathing, wash and dry the skin of your neck. Check your skin for irritation or sores. If you see any, tell your health care provider.  Do not drive while wearing the collar.   Only take over-the-counter or prescription medicines for pain, discomfort, or fever as directed by your health care provider.   Keep all follow-up appointments as directed by your health care provider.   Keep all physical therapy appointments as directed by your health care provider.   Make any needed adjustments to your workstation to promote good posture.   Avoid positions and activities that make your symptoms worse.   Warm up and stretch before being active to help prevent problems.  SEEK MEDICAL CARE IF:   Your pain is not controlled with medicine.   You are unable to decrease your pain medicine over time as planned.   Your activity level is not improving as expected.  SEEK IMMEDIATE MEDICAL CARE IF:   You develop any bleeding.  You develop stomach upset.  You have signs of an allergic reaction to your medicine.   Your symptoms get worse.   You develop new, unexplained symptoms.   You have numbness, tingling, weakness, or paralysis in any part of your body.  MAKE SURE YOU:   Understand these instructions.  Will watch your condition.  Will get help right away if you are not doing well or get worse. Document Released: 01/21/2007 Document Revised: 03/31/2013 Document Reviewed: 10/01/2012 [facility] Patient Information 2015 [place], [place]. This information is not intended to replace advice given to you by your health care provider. Make sure you discuss any questions you  have with your health care provider.  Facial Laceration  A facial laceration is a cut on the face. These injuries can be painful and cause bleeding. Lacerations usually heal quickly, but they need special care to reduce scarring. DIAGNOSIS  Your health care provider will take a medical history, ask for details about how the injury occurred, and examine the wound to determine how deep the cut is. TREATMENT  Some facial lacerations may not require closure. Others may not be able to be closed because of an increased risk of infection. The risk of infection and the chance for successful closure will depend on various factors, including the amount of time since the injury occurred. The wound may be cleaned to help prevent infection. If closure is appropriate, pain medicines may be given if needed. Your health care provider will use stitches (sutures), wound glue (adhesive), or skin adhesive strips to repair the laceration. These tools bring the skin edges together to allow for faster healing  and a better cosmetic outcome. If needed, you may also be given a tetanus shot. HOME CARE INSTRUCTIONS  Only take over-the-counter or prescription medicines as directed by your health care provider.  Follow your health care provider's instructions for wound care. These instructions will vary depending on the technique used for closing the wound. For Sutures:  Keep the wound clean and dry.   If you were given a bandage (dressing), you should change it at least once a day. Also change the dressing if it becomes wet or dirty, or as directed by your health care provider.   Wash the wound with soap and water 2 times a day. Rinse the wound off with water to remove all soap. Pat the wound dry with a clean towel.   After cleaning, apply a thin layer of the antibiotic ointment recommended by your health care provider. This will help prevent infection and keep the dressing from sticking.   You may shower as usual  after the first 24 hours. Do not soak the wound in water until the sutures are removed.   Get your sutures removed as directed by your health care provider. With facial lacerations, sutures should usually be taken out after 4-5 days to avoid stitch marks.   Wait a few days after your sutures are removed before applying any makeup. For Skin Adhesive Strips:  Keep the wound clean and dry.   Do not get the skin adhesive strips wet. You may bathe carefully, using caution to keep the wound dry.   If the wound gets wet, pat it dry with a clean towel.   Skin adhesive strips will fall off on their own. You may trim the strips as the wound heals. Do not remove skin adhesive strips that are still stuck to the wound. They will fall off in time.  For Wound Adhesive:  You may briefly wet your wound in the shower or bath. Do not soak or scrub the wound. Do not swim. Avoid periods of heavy sweating until the skin adhesive has fallen off on its own. After showering or bathing, gently pat the wound dry with a clean towel.   Do not apply liquid medicine, cream medicine, ointment medicine, or makeup to your wound while the skin adhesive is in place. This may loosen the film before your wound is healed.   If a dressing is placed over the wound, be careful not to apply tape directly over the skin adhesive. This may cause the adhesive to be pulled off before the wound is healed.   Avoid prolonged exposure to sunlight or tanning lamps while the skin adhesive is in place.  The skin adhesive will usually remain in place for 5-10 days, then naturally fall off the skin. Do not pick at the adhesive film.  After Healing: Once the wound has healed, cover the wound with sunscreen during the day for 1 full year. This can help minimize scarring. Exposure to ultraviolet light in the first year will darken the scar. It can take 1-2 years for the scar to lose its redness and to heal completely.  SEEK IMMEDIATE  MEDICAL CARE IF:  You have redness, pain, or swelling around the wound.   You see ayellowish-white fluid (pus) coming from the wound.   You have chills or a fever.  MAKE SURE YOU:  Understand these instructions.  Will watch your condition.  Will get help right away if you are not doing well or get worse. Document Released: 05/03/2004 Document Revised:  01/14/2013 Document Reviewed: 11/06/2012 [employer] Patient Information 2015 [place], [place]. This information is not intended to replace advice given to you by your health care provider. Make sure you discuss any questions you have with your health care provider.  Head Injury You have received a head injury. It does not appear serious at this time. Headaches and vomiting are common following head injury. It should be easy to awaken from sleeping. Sometimes it is necessary for you to stay in the emergency department for a while for observation. Sometimes admission to the hospital may be needed. After injuries such as yours, most problems occur within the first 24 hours, but side effects may occur up to 7-10 days after the injury. It is important for you to carefully monitor your condition and contact your health care provider or seek immediate medical care if there is a change in your condition. WHAT ARE THE TYPES OF HEAD INJURIES? Head injuries can be as minor as a bump. Some head injuries can be more severe. More severe head injuries include:  A jarring injury to the brain (concussion).  A bruise of the brain (contusion). This mean there is bleeding in the brain that can cause swelling.  A cracked skull (skull fracture).  Bleeding in the brain that collects, clots, and forms a bump (hematoma). WHAT CAUSES A HEAD INJURY? A serious head injury is most likely to happen to someone who is in a car wreck and is not wearing a seat belt. Other causes of major head injuries include bicycle or motorcycle accidents, sports injuries, and  falls. HOW ARE HEAD INJURIES DIAGNOSED? A complete history of the event leading to the injury and your current symptoms will be helpful in diagnosing head injuries. Many times, pictures of the brain, such as CT or MRI are needed to see the extent of the injury. Often, an overnight hospital stay is necessary for observation.  WHEN SHOULD I SEEK IMMEDIATE MEDICAL CARE?  You should get help right away if:  You have confusion or drowsiness.  You feel sick to your stomach (nauseous) or have continued, forceful vomiting.  You have dizziness or unsteadiness that is getting worse.  You have severe, continued headaches not relieved by medicine. Only take over-the-counter or prescription medicines for pain, fever, or discomfort as directed by your health care provider.  You do not have normal function of the arms or legs or are unable to walk.  You notice changes in the black spots in the center of the colored part of your eye (pupil).  You have a clear or bloody fluid coming from your nose or ears.  You have a loss of vision. During the next 24 hours after the injury, you must stay with someone who can watch you for the warning signs. This person should contact local emergency services (911 in the [place] if you have seizures, you become unconscious, or you are unable to wake up. HOW CAN I PREVENT A HEAD INJURY IN THE FUTURE? The most important factor for preventing major head injuries is avoiding motor vehicle accidents. To minimize the potential for damage to your head, it is crucial to wear seat belts while riding in motor vehicles. Wearing helmets while bike riding and playing collision sports (like football) is also helpful. Also, avoiding dangerous activities around the house will further help reduce your risk of head injury.  WHEN CAN I RETURN TO NORMAL ACTIVITIES AND ATHLETICS? You should be reevaluated by your health care provider before returning to these activities.  If you have any of the  following symptoms, you should not return to activities or contact sports until 1 week after the symptoms have stopped:  Persistent headache.  Dizziness or vertigo.  Poor attention and concentration.  Confusion.  Memory problems.  Nausea or vomiting.  Fatigue or tire easily.  Irritability.  Intolerant of bright lights or loud noises.  Anxiety or depression.  Disturbed sleep. MAKE SURE YOU:   Understand these instructions.  Will watch your condition.  Will get help right away if you are not doing well or get worse. Document Released: 03/26/2005 Document Revised: 03/31/2013 Document Reviewed: 12/01/2012 [facility] Patient Information 2015 [place], [place]. This information is not intended to replace advice given to you by your health care provider. Make sure you discuss any questions you have with your health care provider.  Hip Pain Your hip is the joint between your upper legs and your lower pelvis. The bones, cartilage, tendons, and muscles of your hip joint perform a lot of work each day supporting your body weight and allowing you to move around. Hip pain can range from a minor ache to severe pain in one or both of your hips. Pain may be felt on the inside of the hip joint near the groin, or the outside near the buttocks and upper thigh. You may have swelling or stiffness as well.  HOME CARE INSTRUCTIONS   Take medicines only as directed by your health care provider.  Apply ice to the injured area:  Put ice in a plastic bag.  Place a towel between your skin and the bag.  Leave the ice on for 15-20 minutes at a time, 3-4 times a day.  Keep your leg raised (elevated) when possible to lessen swelling.  Avoid activities that cause pain.  Follow specific exercises as directed by your health care provider.  Sleep with a pillow between your legs on your most comfortable side.  Record how often you have hip pain, the location of the pain, and what it feels like. SEEK  MEDICAL CARE IF:   You are unable to put weight on your leg.  Your hip is red or swollen or very tender to touch.  Your pain or swelling continues or worsens after 1 week.  You have increasing difficulty walking.  You have a fever. SEEK IMMEDIATE MEDICAL CARE IF:   You have fallen.  You have a sudden increase in pain and swelling in your hip. MAKE SURE YOU:   Understand these instructions.  Will watch your condition.  Will get help right away if you are not doing well or get worse. Document Released: 09/13/2009 Document Revised: 08/10/2013 Document Reviewed: 11/20/2012 [facility] Patient Information 2015 [place], [place]. This information is not intended to replace advice given to you by your health care provider. Make sure you discuss any questions you have with your health care provider.

## 2014-10-07 NOTE — ED Notes (Signed)
Patient transported to X-ray 

## 2014-10-07 NOTE — ED Notes (Addendum)
Pt reports she slipped on powder on bathroom floor and fell hitting forehead on side of bathtub. Denies LOC- Lac present to forehead with bleeding controlled- c/o pain in posterior neck- ambulates without difficulty but c/o left hip pain

## 2015-04-21 DIAGNOSIS — G8929 Other chronic pain: Secondary | ICD-10-CM | POA: Diagnosis not present

## 2015-04-21 DIAGNOSIS — Z720 Tobacco use: Secondary | ICD-10-CM | POA: Diagnosis not present

## 2015-04-21 DIAGNOSIS — E785 Hyperlipidemia, unspecified: Secondary | ICD-10-CM | POA: Diagnosis not present

## 2015-04-21 DIAGNOSIS — F329 Major depressive disorder, single episode, unspecified: Secondary | ICD-10-CM | POA: Diagnosis not present

## 2015-04-21 DIAGNOSIS — I10 Essential (primary) hypertension: Secondary | ICD-10-CM | POA: Diagnosis not present

## 2015-04-21 DIAGNOSIS — R05 Cough: Secondary | ICD-10-CM | POA: Diagnosis not present

## 2015-04-21 DIAGNOSIS — J449 Chronic obstructive pulmonary disease, unspecified: Secondary | ICD-10-CM | POA: Diagnosis not present

## 2015-04-21 DIAGNOSIS — E876 Hypokalemia: Secondary | ICD-10-CM | POA: Diagnosis not present

## 2015-04-21 DIAGNOSIS — I119 Hypertensive heart disease without heart failure: Secondary | ICD-10-CM | POA: Diagnosis not present

## 2015-04-21 DIAGNOSIS — R6889 Other general symptoms and signs: Secondary | ICD-10-CM | POA: Diagnosis not present

## 2015-06-06 DIAGNOSIS — E785 Hyperlipidemia, unspecified: Secondary | ICD-10-CM | POA: Diagnosis not present

## 2015-06-06 DIAGNOSIS — I10 Essential (primary) hypertension: Secondary | ICD-10-CM | POA: Diagnosis not present

## 2015-06-10 DIAGNOSIS — R6889 Other general symptoms and signs: Secondary | ICD-10-CM | POA: Diagnosis not present

## 2015-06-13 DIAGNOSIS — G8929 Other chronic pain: Secondary | ICD-10-CM | POA: Diagnosis not present

## 2015-06-13 DIAGNOSIS — M545 Low back pain: Secondary | ICD-10-CM | POA: Diagnosis not present

## 2015-06-13 DIAGNOSIS — F329 Major depressive disorder, single episode, unspecified: Secondary | ICD-10-CM | POA: Diagnosis not present

## 2015-06-13 DIAGNOSIS — I119 Hypertensive heart disease without heart failure: Secondary | ICD-10-CM | POA: Diagnosis not present

## 2015-06-13 DIAGNOSIS — Z720 Tobacco use: Secondary | ICD-10-CM | POA: Diagnosis not present

## 2015-06-13 DIAGNOSIS — I10 Essential (primary) hypertension: Secondary | ICD-10-CM | POA: Diagnosis not present

## 2015-06-13 DIAGNOSIS — Z0001 Encounter for general adult medical examination with abnormal findings: Secondary | ICD-10-CM | POA: Diagnosis not present

## 2015-06-13 DIAGNOSIS — E785 Hyperlipidemia, unspecified: Secondary | ICD-10-CM | POA: Diagnosis not present

## 2015-06-13 DIAGNOSIS — R1013 Epigastric pain: Secondary | ICD-10-CM | POA: Diagnosis not present

## 2015-06-16 DIAGNOSIS — E785 Hyperlipidemia, unspecified: Secondary | ICD-10-CM | POA: Diagnosis not present

## 2015-06-16 DIAGNOSIS — I10 Essential (primary) hypertension: Secondary | ICD-10-CM | POA: Diagnosis not present

## 2015-06-21 DIAGNOSIS — R1013 Epigastric pain: Secondary | ICD-10-CM | POA: Diagnosis not present

## 2015-06-21 DIAGNOSIS — I10 Essential (primary) hypertension: Secondary | ICD-10-CM | POA: Diagnosis not present

## 2015-06-21 DIAGNOSIS — E785 Hyperlipidemia, unspecified: Secondary | ICD-10-CM | POA: Diagnosis not present

## 2015-06-21 DIAGNOSIS — G8929 Other chronic pain: Secondary | ICD-10-CM | POA: Diagnosis not present

## 2015-06-21 DIAGNOSIS — I119 Hypertensive heart disease without heart failure: Secondary | ICD-10-CM | POA: Diagnosis not present

## 2015-06-21 DIAGNOSIS — M545 Low back pain: Secondary | ICD-10-CM | POA: Diagnosis not present

## 2015-06-21 DIAGNOSIS — N390 Urinary tract infection, site not specified: Secondary | ICD-10-CM | POA: Diagnosis not present

## 2015-06-21 DIAGNOSIS — F329 Major depressive disorder, single episode, unspecified: Secondary | ICD-10-CM | POA: Diagnosis not present

## 2015-06-21 DIAGNOSIS — Z720 Tobacco use: Secondary | ICD-10-CM | POA: Diagnosis not present

## 2015-07-12 DIAGNOSIS — R1013 Epigastric pain: Secondary | ICD-10-CM | POA: Diagnosis not present

## 2015-07-12 DIAGNOSIS — R6889 Other general symptoms and signs: Secondary | ICD-10-CM | POA: Diagnosis not present

## 2015-07-12 DIAGNOSIS — F329 Major depressive disorder, single episode, unspecified: Secondary | ICD-10-CM | POA: Diagnosis not present

## 2015-07-12 DIAGNOSIS — Z0000 Encounter for general adult medical examination without abnormal findings: Secondary | ICD-10-CM | POA: Diagnosis not present

## 2015-07-12 DIAGNOSIS — N390 Urinary tract infection, site not specified: Secondary | ICD-10-CM | POA: Diagnosis not present

## 2015-07-12 DIAGNOSIS — Z720 Tobacco use: Secondary | ICD-10-CM | POA: Diagnosis not present

## 2015-07-12 DIAGNOSIS — I119 Hypertensive heart disease without heart failure: Secondary | ICD-10-CM | POA: Diagnosis not present

## 2015-07-12 DIAGNOSIS — M25559 Pain in unspecified hip: Secondary | ICD-10-CM | POA: Diagnosis not present

## 2015-07-12 DIAGNOSIS — G8929 Other chronic pain: Secondary | ICD-10-CM | POA: Diagnosis not present

## 2015-07-12 DIAGNOSIS — M545 Low back pain: Secondary | ICD-10-CM | POA: Diagnosis not present

## 2015-07-12 DIAGNOSIS — I10 Essential (primary) hypertension: Secondary | ICD-10-CM | POA: Diagnosis not present

## 2015-07-12 DIAGNOSIS — E785 Hyperlipidemia, unspecified: Secondary | ICD-10-CM | POA: Diagnosis not present

## 2015-07-28 DIAGNOSIS — E785 Hyperlipidemia, unspecified: Secondary | ICD-10-CM | POA: Diagnosis not present

## 2015-07-28 DIAGNOSIS — I10 Essential (primary) hypertension: Secondary | ICD-10-CM | POA: Diagnosis not present

## 2015-08-10 DIAGNOSIS — I7300 Raynaud's syndrome without gangrene: Secondary | ICD-10-CM | POA: Diagnosis not present

## 2015-08-10 DIAGNOSIS — R0989 Other specified symptoms and signs involving the circulatory and respiratory systems: Secondary | ICD-10-CM | POA: Diagnosis not present

## 2015-08-10 DIAGNOSIS — Z87891 Personal history of nicotine dependence: Secondary | ICD-10-CM | POA: Diagnosis not present

## 2015-08-18 DIAGNOSIS — E785 Hyperlipidemia, unspecified: Secondary | ICD-10-CM | POA: Diagnosis not present

## 2015-08-18 DIAGNOSIS — I10 Essential (primary) hypertension: Secondary | ICD-10-CM | POA: Diagnosis not present

## 2015-08-29 DIAGNOSIS — R1013 Epigastric pain: Secondary | ICD-10-CM | POA: Diagnosis not present

## 2015-08-29 DIAGNOSIS — G8929 Other chronic pain: Secondary | ICD-10-CM | POA: Diagnosis not present

## 2015-08-29 DIAGNOSIS — Z720 Tobacco use: Secondary | ICD-10-CM | POA: Diagnosis not present

## 2015-08-29 DIAGNOSIS — F329 Major depressive disorder, single episode, unspecified: Secondary | ICD-10-CM | POA: Diagnosis not present

## 2015-08-29 DIAGNOSIS — Z0001 Encounter for general adult medical examination with abnormal findings: Secondary | ICD-10-CM | POA: Diagnosis not present

## 2015-08-29 DIAGNOSIS — M545 Low back pain: Secondary | ICD-10-CM | POA: Diagnosis not present

## 2015-08-29 DIAGNOSIS — I119 Hypertensive heart disease without heart failure: Secondary | ICD-10-CM | POA: Diagnosis not present

## 2015-08-29 DIAGNOSIS — E785 Hyperlipidemia, unspecified: Secondary | ICD-10-CM | POA: Diagnosis not present

## 2015-08-29 DIAGNOSIS — I10 Essential (primary) hypertension: Secondary | ICD-10-CM | POA: Diagnosis not present

## 2015-09-20 DIAGNOSIS — E785 Hyperlipidemia, unspecified: Secondary | ICD-10-CM | POA: Diagnosis not present

## 2015-09-20 DIAGNOSIS — I10 Essential (primary) hypertension: Secondary | ICD-10-CM | POA: Diagnosis not present

## 2015-10-13 DIAGNOSIS — G8929 Other chronic pain: Secondary | ICD-10-CM | POA: Diagnosis not present

## 2015-10-13 DIAGNOSIS — M545 Low back pain: Secondary | ICD-10-CM | POA: Diagnosis not present

## 2015-10-13 DIAGNOSIS — R1013 Epigastric pain: Secondary | ICD-10-CM | POA: Diagnosis not present

## 2015-10-13 DIAGNOSIS — J449 Chronic obstructive pulmonary disease, unspecified: Secondary | ICD-10-CM | POA: Diagnosis not present

## 2015-10-13 DIAGNOSIS — E785 Hyperlipidemia, unspecified: Secondary | ICD-10-CM | POA: Diagnosis not present

## 2015-10-13 DIAGNOSIS — Z720 Tobacco use: Secondary | ICD-10-CM | POA: Diagnosis not present

## 2015-10-13 DIAGNOSIS — I10 Essential (primary) hypertension: Secondary | ICD-10-CM | POA: Diagnosis not present

## 2015-10-13 DIAGNOSIS — F329 Major depressive disorder, single episode, unspecified: Secondary | ICD-10-CM | POA: Diagnosis not present

## 2015-10-13 DIAGNOSIS — G629 Polyneuropathy, unspecified: Secondary | ICD-10-CM | POA: Diagnosis not present

## 2015-10-31 DIAGNOSIS — E785 Hyperlipidemia, unspecified: Secondary | ICD-10-CM | POA: Diagnosis not present

## 2015-10-31 DIAGNOSIS — I10 Essential (primary) hypertension: Secondary | ICD-10-CM | POA: Diagnosis not present

## 2015-11-10 DIAGNOSIS — F329 Major depressive disorder, single episode, unspecified: Secondary | ICD-10-CM | POA: Diagnosis not present

## 2015-11-10 DIAGNOSIS — J449 Chronic obstructive pulmonary disease, unspecified: Secondary | ICD-10-CM | POA: Diagnosis not present

## 2015-11-10 DIAGNOSIS — G629 Polyneuropathy, unspecified: Secondary | ICD-10-CM | POA: Diagnosis not present

## 2015-11-10 DIAGNOSIS — R1013 Epigastric pain: Secondary | ICD-10-CM | POA: Diagnosis not present

## 2015-11-10 DIAGNOSIS — I10 Essential (primary) hypertension: Secondary | ICD-10-CM | POA: Diagnosis not present

## 2015-11-10 DIAGNOSIS — G8929 Other chronic pain: Secondary | ICD-10-CM | POA: Diagnosis not present

## 2015-11-10 DIAGNOSIS — M545 Low back pain: Secondary | ICD-10-CM | POA: Diagnosis not present

## 2015-11-10 DIAGNOSIS — Z5181 Encounter for therapeutic drug level monitoring: Secondary | ICD-10-CM | POA: Diagnosis not present

## 2015-11-10 DIAGNOSIS — Z720 Tobacco use: Secondary | ICD-10-CM | POA: Diagnosis not present

## 2015-11-10 DIAGNOSIS — E785 Hyperlipidemia, unspecified: Secondary | ICD-10-CM | POA: Diagnosis not present

## 2015-11-22 DIAGNOSIS — I10 Essential (primary) hypertension: Secondary | ICD-10-CM | POA: Diagnosis not present

## 2015-11-22 DIAGNOSIS — E785 Hyperlipidemia, unspecified: Secondary | ICD-10-CM | POA: Diagnosis not present

## 2015-12-13 DIAGNOSIS — H40019 Open angle with borderline findings, low risk, unspecified eye: Secondary | ICD-10-CM | POA: Diagnosis not present

## 2015-12-13 DIAGNOSIS — H52223 Regular astigmatism, bilateral: Secondary | ICD-10-CM | POA: Diagnosis not present

## 2015-12-13 DIAGNOSIS — H40013 Open angle with borderline findings, low risk, bilateral: Secondary | ICD-10-CM | POA: Diagnosis not present

## 2015-12-13 DIAGNOSIS — H5210 Myopia, unspecified eye: Secondary | ICD-10-CM | POA: Diagnosis not present

## 2016-01-02 DIAGNOSIS — M545 Low back pain: Secondary | ICD-10-CM | POA: Diagnosis not present

## 2016-01-02 DIAGNOSIS — G8929 Other chronic pain: Secondary | ICD-10-CM | POA: Diagnosis not present

## 2016-01-02 DIAGNOSIS — G894 Chronic pain syndrome: Secondary | ICD-10-CM | POA: Diagnosis not present

## 2016-01-05 DIAGNOSIS — I10 Essential (primary) hypertension: Secondary | ICD-10-CM | POA: Diagnosis not present

## 2016-01-05 DIAGNOSIS — E785 Hyperlipidemia, unspecified: Secondary | ICD-10-CM | POA: Diagnosis not present

## 2016-02-01 DIAGNOSIS — M545 Low back pain: Secondary | ICD-10-CM | POA: Diagnosis not present

## 2016-02-01 DIAGNOSIS — G8929 Other chronic pain: Secondary | ICD-10-CM | POA: Diagnosis not present

## 2016-02-01 DIAGNOSIS — Z5181 Encounter for therapeutic drug level monitoring: Secondary | ICD-10-CM | POA: Diagnosis not present

## 2016-02-01 DIAGNOSIS — G894 Chronic pain syndrome: Secondary | ICD-10-CM | POA: Diagnosis not present

## 2016-02-02 DIAGNOSIS — I10 Essential (primary) hypertension: Secondary | ICD-10-CM | POA: Diagnosis not present

## 2016-02-02 DIAGNOSIS — E785 Hyperlipidemia, unspecified: Secondary | ICD-10-CM | POA: Diagnosis not present

## 2016-02-16 DIAGNOSIS — Z5181 Encounter for therapeutic drug level monitoring: Secondary | ICD-10-CM | POA: Diagnosis not present

## 2016-02-16 DIAGNOSIS — M545 Low back pain: Secondary | ICD-10-CM | POA: Diagnosis not present

## 2016-02-16 DIAGNOSIS — Z720 Tobacco use: Secondary | ICD-10-CM | POA: Diagnosis not present

## 2016-02-16 DIAGNOSIS — J449 Chronic obstructive pulmonary disease, unspecified: Secondary | ICD-10-CM | POA: Diagnosis not present

## 2016-02-16 DIAGNOSIS — G8929 Other chronic pain: Secondary | ICD-10-CM | POA: Diagnosis not present

## 2016-02-16 DIAGNOSIS — I10 Essential (primary) hypertension: Secondary | ICD-10-CM | POA: Diagnosis not present

## 2016-02-16 DIAGNOSIS — G629 Polyneuropathy, unspecified: Secondary | ICD-10-CM | POA: Diagnosis not present

## 2016-02-16 DIAGNOSIS — F329 Major depressive disorder, single episode, unspecified: Secondary | ICD-10-CM | POA: Diagnosis not present

## 2016-02-16 DIAGNOSIS — N939 Abnormal uterine and vaginal bleeding, unspecified: Secondary | ICD-10-CM | POA: Diagnosis not present

## 2016-02-16 DIAGNOSIS — E785 Hyperlipidemia, unspecified: Secondary | ICD-10-CM | POA: Diagnosis not present

## 2016-02-16 DIAGNOSIS — R3915 Urgency of urination: Secondary | ICD-10-CM | POA: Diagnosis not present

## 2016-02-29 DIAGNOSIS — I10 Essential (primary) hypertension: Secondary | ICD-10-CM | POA: Diagnosis not present

## 2016-02-29 DIAGNOSIS — E785 Hyperlipidemia, unspecified: Secondary | ICD-10-CM | POA: Diagnosis not present

## 2016-03-13 DIAGNOSIS — R350 Frequency of micturition: Secondary | ICD-10-CM | POA: Diagnosis not present

## 2016-03-13 DIAGNOSIS — N3946 Mixed incontinence: Secondary | ICD-10-CM | POA: Diagnosis not present

## 2016-03-13 DIAGNOSIS — R6889 Other general symptoms and signs: Secondary | ICD-10-CM | POA: Diagnosis not present

## 2016-03-13 DIAGNOSIS — N841 Polyp of cervix uteri: Secondary | ICD-10-CM | POA: Diagnosis not present

## 2016-03-13 DIAGNOSIS — N950 Postmenopausal bleeding: Secondary | ICD-10-CM | POA: Diagnosis not present

## 2016-03-13 DIAGNOSIS — N3000 Acute cystitis without hematuria: Secondary | ICD-10-CM | POA: Diagnosis not present

## 2016-03-16 DIAGNOSIS — E288 Other ovarian dysfunction: Secondary | ICD-10-CM | POA: Diagnosis not present

## 2016-03-16 DIAGNOSIS — N950 Postmenopausal bleeding: Secondary | ICD-10-CM | POA: Diagnosis not present

## 2016-03-16 DIAGNOSIS — N840 Polyp of corpus uteri: Secondary | ICD-10-CM | POA: Diagnosis not present

## 2016-03-16 DIAGNOSIS — R87619 Unspecified abnormal cytological findings in specimens from cervix uteri: Secondary | ICD-10-CM | POA: Diagnosis not present

## 2016-03-16 DIAGNOSIS — N841 Polyp of cervix uteri: Secondary | ICD-10-CM | POA: Diagnosis not present

## 2016-03-29 DIAGNOSIS — I10 Essential (primary) hypertension: Secondary | ICD-10-CM | POA: Diagnosis not present

## 2016-03-29 DIAGNOSIS — E785 Hyperlipidemia, unspecified: Secondary | ICD-10-CM | POA: Diagnosis not present

## 2016-04-11 DIAGNOSIS — N950 Postmenopausal bleeding: Secondary | ICD-10-CM | POA: Diagnosis not present

## 2016-04-16 DIAGNOSIS — N950 Postmenopausal bleeding: Secondary | ICD-10-CM | POA: Diagnosis not present

## 2016-04-16 DIAGNOSIS — N840 Polyp of corpus uteri: Secondary | ICD-10-CM | POA: Diagnosis not present

## 2016-04-30 DIAGNOSIS — M545 Low back pain: Secondary | ICD-10-CM | POA: Diagnosis not present

## 2016-04-30 DIAGNOSIS — R6889 Other general symptoms and signs: Secondary | ICD-10-CM | POA: Diagnosis not present

## 2016-04-30 DIAGNOSIS — G894 Chronic pain syndrome: Secondary | ICD-10-CM | POA: Diagnosis not present

## 2016-04-30 DIAGNOSIS — G8929 Other chronic pain: Secondary | ICD-10-CM | POA: Diagnosis not present

## 2016-05-15 DIAGNOSIS — R350 Frequency of micturition: Secondary | ICD-10-CM | POA: Diagnosis not present

## 2016-05-17 DIAGNOSIS — Z720 Tobacco use: Secondary | ICD-10-CM | POA: Diagnosis not present

## 2016-05-17 DIAGNOSIS — M545 Low back pain: Secondary | ICD-10-CM | POA: Diagnosis not present

## 2016-05-17 DIAGNOSIS — I10 Essential (primary) hypertension: Secondary | ICD-10-CM | POA: Diagnosis not present

## 2016-05-17 DIAGNOSIS — E785 Hyperlipidemia, unspecified: Secondary | ICD-10-CM | POA: Diagnosis not present

## 2016-05-17 DIAGNOSIS — Z23 Encounter for immunization: Secondary | ICD-10-CM | POA: Diagnosis not present

## 2016-05-17 DIAGNOSIS — R3915 Urgency of urination: Secondary | ICD-10-CM | POA: Diagnosis not present

## 2016-06-21 DIAGNOSIS — M545 Low back pain: Secondary | ICD-10-CM | POA: Diagnosis not present

## 2016-06-21 DIAGNOSIS — I10 Essential (primary) hypertension: Secondary | ICD-10-CM | POA: Diagnosis not present

## 2016-06-21 DIAGNOSIS — R3915 Urgency of urination: Secondary | ICD-10-CM | POA: Diagnosis not present

## 2016-06-21 DIAGNOSIS — Z0000 Encounter for general adult medical examination without abnormal findings: Secondary | ICD-10-CM | POA: Diagnosis not present

## 2016-06-25 DIAGNOSIS — M65872 Other synovitis and tenosynovitis, left ankle and foot: Secondary | ICD-10-CM | POA: Diagnosis not present

## 2016-06-25 DIAGNOSIS — M79671 Pain in right foot: Secondary | ICD-10-CM | POA: Diagnosis not present

## 2016-06-25 DIAGNOSIS — B351 Tinea unguium: Secondary | ICD-10-CM | POA: Diagnosis not present

## 2016-06-25 DIAGNOSIS — M779 Enthesopathy, unspecified: Secondary | ICD-10-CM | POA: Diagnosis not present

## 2016-06-25 DIAGNOSIS — L84 Corns and callosities: Secondary | ICD-10-CM | POA: Diagnosis not present

## 2016-06-25 DIAGNOSIS — M79672 Pain in left foot: Secondary | ICD-10-CM | POA: Diagnosis not present

## 2016-06-26 DIAGNOSIS — R350 Frequency of micturition: Secondary | ICD-10-CM | POA: Diagnosis not present

## 2016-07-05 DIAGNOSIS — Z720 Tobacco use: Secondary | ICD-10-CM | POA: Diagnosis not present

## 2016-07-05 DIAGNOSIS — M545 Low back pain: Secondary | ICD-10-CM | POA: Diagnosis not present

## 2016-07-05 DIAGNOSIS — E785 Hyperlipidemia, unspecified: Secondary | ICD-10-CM | POA: Diagnosis not present

## 2016-07-05 DIAGNOSIS — R3915 Urgency of urination: Secondary | ICD-10-CM | POA: Diagnosis not present

## 2016-07-05 DIAGNOSIS — I10 Essential (primary) hypertension: Secondary | ICD-10-CM | POA: Diagnosis not present

## 2016-07-23 DIAGNOSIS — Z5181 Encounter for therapeutic drug level monitoring: Secondary | ICD-10-CM | POA: Diagnosis not present

## 2016-07-23 DIAGNOSIS — M545 Low back pain: Secondary | ICD-10-CM | POA: Diagnosis not present

## 2016-07-23 DIAGNOSIS — M79672 Pain in left foot: Secondary | ICD-10-CM | POA: Diagnosis not present

## 2016-07-23 DIAGNOSIS — G894 Chronic pain syndrome: Secondary | ICD-10-CM | POA: Diagnosis not present

## 2016-07-23 DIAGNOSIS — M67472 Ganglion, left ankle and foot: Secondary | ICD-10-CM | POA: Diagnosis not present

## 2016-07-23 DIAGNOSIS — G8929 Other chronic pain: Secondary | ICD-10-CM | POA: Diagnosis not present

## 2016-08-30 DIAGNOSIS — M545 Low back pain: Secondary | ICD-10-CM | POA: Diagnosis not present

## 2016-08-30 DIAGNOSIS — G894 Chronic pain syndrome: Secondary | ICD-10-CM | POA: Diagnosis not present

## 2016-08-30 DIAGNOSIS — G8929 Other chronic pain: Secondary | ICD-10-CM | POA: Diagnosis not present

## 2016-08-30 DIAGNOSIS — Z5181 Encounter for therapeutic drug level monitoring: Secondary | ICD-10-CM | POA: Diagnosis not present

## 2016-09-07 DIAGNOSIS — I10 Essential (primary) hypertension: Secondary | ICD-10-CM | POA: Diagnosis not present

## 2016-09-07 DIAGNOSIS — Z5181 Encounter for therapeutic drug level monitoring: Secondary | ICD-10-CM | POA: Diagnosis not present

## 2016-09-07 DIAGNOSIS — E785 Hyperlipidemia, unspecified: Secondary | ICD-10-CM | POA: Diagnosis not present

## 2016-09-07 DIAGNOSIS — Z01118 Encounter for examination of ears and hearing with other abnormal findings: Secondary | ICD-10-CM | POA: Diagnosis not present

## 2016-09-07 DIAGNOSIS — R7303 Prediabetes: Secondary | ICD-10-CM | POA: Diagnosis not present

## 2016-09-07 DIAGNOSIS — Z0000 Encounter for general adult medical examination without abnormal findings: Secondary | ICD-10-CM | POA: Diagnosis not present

## 2016-09-07 DIAGNOSIS — Z720 Tobacco use: Secondary | ICD-10-CM | POA: Diagnosis not present

## 2016-09-21 DIAGNOSIS — E559 Vitamin D deficiency, unspecified: Secondary | ICD-10-CM | POA: Diagnosis not present

## 2016-09-21 DIAGNOSIS — E785 Hyperlipidemia, unspecified: Secondary | ICD-10-CM | POA: Diagnosis not present

## 2016-09-21 DIAGNOSIS — I10 Essential (primary) hypertension: Secondary | ICD-10-CM | POA: Diagnosis not present

## 2016-09-21 DIAGNOSIS — J449 Chronic obstructive pulmonary disease, unspecified: Secondary | ICD-10-CM | POA: Diagnosis not present

## 2016-10-25 DIAGNOSIS — G894 Chronic pain syndrome: Secondary | ICD-10-CM | POA: Diagnosis not present

## 2016-10-25 DIAGNOSIS — G8929 Other chronic pain: Secondary | ICD-10-CM | POA: Diagnosis not present

## 2016-10-25 DIAGNOSIS — M545 Low back pain: Secondary | ICD-10-CM | POA: Diagnosis not present

## 2016-11-08 DIAGNOSIS — N390 Urinary tract infection, site not specified: Secondary | ICD-10-CM | POA: Diagnosis not present

## 2016-12-11 DIAGNOSIS — E559 Vitamin D deficiency, unspecified: Secondary | ICD-10-CM | POA: Diagnosis not present

## 2016-12-11 DIAGNOSIS — E785 Hyperlipidemia, unspecified: Secondary | ICD-10-CM | POA: Diagnosis not present

## 2016-12-11 DIAGNOSIS — I10 Essential (primary) hypertension: Secondary | ICD-10-CM | POA: Diagnosis not present

## 2016-12-11 DIAGNOSIS — T63441S Toxic effect of venom of bees, accidental (unintentional), sequela: Secondary | ICD-10-CM | POA: Diagnosis not present

## 2017-02-11 DIAGNOSIS — M7542 Impingement syndrome of left shoulder: Secondary | ICD-10-CM | POA: Diagnosis not present

## 2017-02-11 DIAGNOSIS — G8929 Other chronic pain: Secondary | ICD-10-CM | POA: Diagnosis not present

## 2017-02-11 DIAGNOSIS — M545 Low back pain: Secondary | ICD-10-CM | POA: Diagnosis not present

## 2017-02-11 DIAGNOSIS — G894 Chronic pain syndrome: Secondary | ICD-10-CM | POA: Diagnosis not present

## 2017-02-19 DIAGNOSIS — E785 Hyperlipidemia, unspecified: Secondary | ICD-10-CM | POA: Diagnosis not present

## 2017-02-19 DIAGNOSIS — I10 Essential (primary) hypertension: Secondary | ICD-10-CM | POA: Diagnosis not present

## 2017-02-19 DIAGNOSIS — J449 Chronic obstructive pulmonary disease, unspecified: Secondary | ICD-10-CM | POA: Diagnosis not present

## 2017-02-19 DIAGNOSIS — E559 Vitamin D deficiency, unspecified: Secondary | ICD-10-CM | POA: Diagnosis not present

## 2017-02-19 DIAGNOSIS — R7303 Prediabetes: Secondary | ICD-10-CM | POA: Diagnosis not present

## 2017-02-27 DIAGNOSIS — G8929 Other chronic pain: Secondary | ICD-10-CM | POA: Diagnosis not present

## 2017-02-27 DIAGNOSIS — I10 Essential (primary) hypertension: Secondary | ICD-10-CM | POA: Diagnosis not present

## 2017-04-16 DIAGNOSIS — J449 Chronic obstructive pulmonary disease, unspecified: Secondary | ICD-10-CM | POA: Diagnosis not present

## 2017-04-16 DIAGNOSIS — E559 Vitamin D deficiency, unspecified: Secondary | ICD-10-CM | POA: Diagnosis not present

## 2017-04-16 DIAGNOSIS — E785 Hyperlipidemia, unspecified: Secondary | ICD-10-CM | POA: Diagnosis not present

## 2017-04-16 DIAGNOSIS — I10 Essential (primary) hypertension: Secondary | ICD-10-CM | POA: Diagnosis not present

## 2017-05-06 DIAGNOSIS — M545 Low back pain: Secondary | ICD-10-CM | POA: Diagnosis not present

## 2017-05-06 DIAGNOSIS — M7542 Impingement syndrome of left shoulder: Secondary | ICD-10-CM | POA: Diagnosis not present

## 2017-05-06 DIAGNOSIS — Z79891 Long term (current) use of opiate analgesic: Secondary | ICD-10-CM | POA: Diagnosis not present

## 2017-05-06 DIAGNOSIS — G8929 Other chronic pain: Secondary | ICD-10-CM | POA: Diagnosis not present

## 2017-05-06 DIAGNOSIS — G894 Chronic pain syndrome: Secondary | ICD-10-CM | POA: Diagnosis not present

## 2017-05-21 DIAGNOSIS — E785 Hyperlipidemia, unspecified: Secondary | ICD-10-CM | POA: Diagnosis not present

## 2017-05-21 DIAGNOSIS — R079 Chest pain, unspecified: Secondary | ICD-10-CM | POA: Diagnosis not present

## 2017-05-21 DIAGNOSIS — I10 Essential (primary) hypertension: Secondary | ICD-10-CM | POA: Diagnosis not present

## 2017-05-21 DIAGNOSIS — J449 Chronic obstructive pulmonary disease, unspecified: Secondary | ICD-10-CM | POA: Diagnosis not present

## 2017-05-21 DIAGNOSIS — E559 Vitamin D deficiency, unspecified: Secondary | ICD-10-CM | POA: Diagnosis not present

## 2017-05-29 DIAGNOSIS — I10 Essential (primary) hypertension: Secondary | ICD-10-CM | POA: Diagnosis not present

## 2017-05-29 DIAGNOSIS — G8929 Other chronic pain: Secondary | ICD-10-CM | POA: Diagnosis not present

## 2017-06-18 DIAGNOSIS — H53453 Other localized visual field defect, bilateral: Secondary | ICD-10-CM | POA: Diagnosis not present

## 2017-06-18 DIAGNOSIS — H43821 Vitreomacular adhesion, right eye: Secondary | ICD-10-CM | POA: Diagnosis not present

## 2017-06-18 DIAGNOSIS — H35363 Drusen (degenerative) of macula, bilateral: Secondary | ICD-10-CM | POA: Diagnosis not present

## 2017-06-28 DIAGNOSIS — I10 Essential (primary) hypertension: Secondary | ICD-10-CM | POA: Diagnosis not present

## 2017-06-28 DIAGNOSIS — R079 Chest pain, unspecified: Secondary | ICD-10-CM | POA: Diagnosis not present

## 2017-06-28 DIAGNOSIS — R9431 Abnormal electrocardiogram [ECG] [EKG]: Secondary | ICD-10-CM | POA: Diagnosis not present

## 2017-07-02 DIAGNOSIS — M7542 Impingement syndrome of left shoulder: Secondary | ICD-10-CM | POA: Diagnosis not present

## 2017-07-02 DIAGNOSIS — M545 Low back pain: Secondary | ICD-10-CM | POA: Diagnosis not present

## 2017-07-02 DIAGNOSIS — G894 Chronic pain syndrome: Secondary | ICD-10-CM | POA: Diagnosis not present

## 2017-07-02 DIAGNOSIS — M25572 Pain in left ankle and joints of left foot: Secondary | ICD-10-CM | POA: Diagnosis not present

## 2017-07-02 DIAGNOSIS — G8929 Other chronic pain: Secondary | ICD-10-CM | POA: Diagnosis not present

## 2017-07-02 DIAGNOSIS — Z79891 Long term (current) use of opiate analgesic: Secondary | ICD-10-CM | POA: Diagnosis not present

## 2017-07-03 DIAGNOSIS — J449 Chronic obstructive pulmonary disease, unspecified: Secondary | ICD-10-CM | POA: Diagnosis not present

## 2017-07-03 DIAGNOSIS — I10 Essential (primary) hypertension: Secondary | ICD-10-CM | POA: Diagnosis not present

## 2017-07-09 DIAGNOSIS — E559 Vitamin D deficiency, unspecified: Secondary | ICD-10-CM | POA: Diagnosis not present

## 2017-07-09 DIAGNOSIS — E785 Hyperlipidemia, unspecified: Secondary | ICD-10-CM | POA: Diagnosis not present

## 2017-07-09 DIAGNOSIS — I10 Essential (primary) hypertension: Secondary | ICD-10-CM | POA: Diagnosis not present

## 2017-07-09 DIAGNOSIS — Z0000 Encounter for general adult medical examination without abnormal findings: Secondary | ICD-10-CM | POA: Diagnosis not present

## 2017-07-31 DIAGNOSIS — E785 Hyperlipidemia, unspecified: Secondary | ICD-10-CM | POA: Diagnosis not present

## 2017-07-31 DIAGNOSIS — I119 Hypertensive heart disease without heart failure: Secondary | ICD-10-CM | POA: Diagnosis not present

## 2017-08-19 DIAGNOSIS — M25572 Pain in left ankle and joints of left foot: Secondary | ICD-10-CM | POA: Diagnosis not present

## 2017-08-19 DIAGNOSIS — G8929 Other chronic pain: Secondary | ICD-10-CM | POA: Diagnosis not present

## 2017-08-19 DIAGNOSIS — M71372 Other bursal cyst, left ankle and foot: Secondary | ICD-10-CM | POA: Diagnosis not present

## 2017-08-22 DIAGNOSIS — R2242 Localized swelling, mass and lump, left lower limb: Secondary | ICD-10-CM | POA: Diagnosis not present

## 2017-08-28 DIAGNOSIS — M545 Low back pain: Secondary | ICD-10-CM | POA: Diagnosis not present

## 2017-08-28 DIAGNOSIS — J449 Chronic obstructive pulmonary disease, unspecified: Secondary | ICD-10-CM | POA: Diagnosis not present

## 2017-08-28 DIAGNOSIS — G8929 Other chronic pain: Secondary | ICD-10-CM | POA: Diagnosis not present

## 2017-08-28 DIAGNOSIS — G894 Chronic pain syndrome: Secondary | ICD-10-CM | POA: Diagnosis not present

## 2017-08-28 DIAGNOSIS — M7542 Impingement syndrome of left shoulder: Secondary | ICD-10-CM | POA: Diagnosis not present

## 2017-08-28 DIAGNOSIS — I10 Essential (primary) hypertension: Secondary | ICD-10-CM | POA: Diagnosis not present

## 2017-09-09 DIAGNOSIS — J449 Chronic obstructive pulmonary disease, unspecified: Secondary | ICD-10-CM | POA: Diagnosis not present

## 2017-09-09 DIAGNOSIS — E785 Hyperlipidemia, unspecified: Secondary | ICD-10-CM | POA: Diagnosis not present

## 2017-09-09 DIAGNOSIS — Z5181 Encounter for therapeutic drug level monitoring: Secondary | ICD-10-CM | POA: Diagnosis not present

## 2017-09-09 DIAGNOSIS — Z136 Encounter for screening for cardiovascular disorders: Secondary | ICD-10-CM | POA: Diagnosis not present

## 2017-09-09 DIAGNOSIS — Z01118 Encounter for examination of ears and hearing with other abnormal findings: Secondary | ICD-10-CM | POA: Diagnosis not present

## 2017-09-09 DIAGNOSIS — Z0000 Encounter for general adult medical examination without abnormal findings: Secondary | ICD-10-CM | POA: Diagnosis not present

## 2017-09-09 DIAGNOSIS — R2242 Localized swelling, mass and lump, left lower limb: Secondary | ICD-10-CM | POA: Diagnosis not present

## 2017-09-09 DIAGNOSIS — Z131 Encounter for screening for diabetes mellitus: Secondary | ICD-10-CM | POA: Diagnosis not present

## 2017-09-12 DIAGNOSIS — M71372 Other bursal cyst, left ankle and foot: Secondary | ICD-10-CM | POA: Diagnosis not present

## 2017-09-12 DIAGNOSIS — M6740 Ganglion, unspecified site: Secondary | ICD-10-CM | POA: Diagnosis not present

## 2017-09-25 DIAGNOSIS — H43821 Vitreomacular adhesion, right eye: Secondary | ICD-10-CM | POA: Diagnosis not present

## 2017-09-25 DIAGNOSIS — H16223 Keratoconjunctivitis sicca, not specified as Sjogren's, bilateral: Secondary | ICD-10-CM | POA: Diagnosis not present

## 2017-10-21 DIAGNOSIS — K219 Gastro-esophageal reflux disease without esophagitis: Secondary | ICD-10-CM | POA: Diagnosis not present

## 2017-10-21 DIAGNOSIS — I10 Essential (primary) hypertension: Secondary | ICD-10-CM | POA: Diagnosis not present

## 2017-10-21 DIAGNOSIS — E785 Hyperlipidemia, unspecified: Secondary | ICD-10-CM | POA: Diagnosis not present

## 2017-10-21 DIAGNOSIS — E559 Vitamin D deficiency, unspecified: Secondary | ICD-10-CM | POA: Diagnosis not present

## 2017-10-21 DIAGNOSIS — J449 Chronic obstructive pulmonary disease, unspecified: Secondary | ICD-10-CM | POA: Diagnosis not present

## 2017-10-29 DIAGNOSIS — R928 Other abnormal and inconclusive findings on diagnostic imaging of breast: Secondary | ICD-10-CM | POA: Diagnosis not present

## 2017-10-29 DIAGNOSIS — N6489 Other specified disorders of breast: Secondary | ICD-10-CM | POA: Diagnosis not present

## 2017-11-07 DIAGNOSIS — H43821 Vitreomacular adhesion, right eye: Secondary | ICD-10-CM | POA: Diagnosis not present

## 2017-11-07 DIAGNOSIS — H5361 Abnormal dark adaptation curve: Secondary | ICD-10-CM | POA: Diagnosis not present

## 2017-11-25 DIAGNOSIS — G894 Chronic pain syndrome: Secondary | ICD-10-CM | POA: Diagnosis not present

## 2017-11-25 DIAGNOSIS — M533 Sacrococcygeal disorders, not elsewhere classified: Secondary | ICD-10-CM | POA: Diagnosis not present

## 2017-11-26 DIAGNOSIS — H903 Sensorineural hearing loss, bilateral: Secondary | ICD-10-CM | POA: Diagnosis not present

## 2017-12-12 DIAGNOSIS — R2242 Localized swelling, mass and lump, left lower limb: Secondary | ICD-10-CM | POA: Diagnosis not present

## 2017-12-12 DIAGNOSIS — M6740 Ganglion, unspecified site: Secondary | ICD-10-CM | POA: Diagnosis not present

## 2017-12-20 DIAGNOSIS — M5442 Lumbago with sciatica, left side: Secondary | ICD-10-CM | POA: Diagnosis not present

## 2017-12-20 DIAGNOSIS — E559 Vitamin D deficiency, unspecified: Secondary | ICD-10-CM | POA: Diagnosis not present

## 2017-12-20 DIAGNOSIS — E785 Hyperlipidemia, unspecified: Secondary | ICD-10-CM | POA: Diagnosis not present

## 2018-02-19 DIAGNOSIS — E559 Vitamin D deficiency, unspecified: Secondary | ICD-10-CM | POA: Diagnosis not present

## 2018-02-19 DIAGNOSIS — R05 Cough: Secondary | ICD-10-CM | POA: Diagnosis not present

## 2018-02-19 DIAGNOSIS — I10 Essential (primary) hypertension: Secondary | ICD-10-CM | POA: Diagnosis not present

## 2018-02-19 DIAGNOSIS — E785 Hyperlipidemia, unspecified: Secondary | ICD-10-CM | POA: Diagnosis not present

## 2018-03-04 DIAGNOSIS — R2242 Localized swelling, mass and lump, left lower limb: Secondary | ICD-10-CM | POA: Diagnosis not present

## 2018-03-04 DIAGNOSIS — M25572 Pain in left ankle and joints of left foot: Secondary | ICD-10-CM | POA: Diagnosis not present

## 2018-03-04 DIAGNOSIS — G8929 Other chronic pain: Secondary | ICD-10-CM | POA: Diagnosis not present

## 2018-03-04 DIAGNOSIS — M71372 Other bursal cyst, left ankle and foot: Secondary | ICD-10-CM | POA: Diagnosis not present

## 2018-03-04 DIAGNOSIS — M6740 Ganglion, unspecified site: Secondary | ICD-10-CM | POA: Diagnosis not present

## 2018-03-10 ENCOUNTER — Other Ambulatory Visit: Payer: Self-pay

## 2018-03-10 DIAGNOSIS — Z23 Encounter for immunization: Secondary | ICD-10-CM | POA: Diagnosis not present

## 2018-03-10 DIAGNOSIS — I10 Essential (primary) hypertension: Secondary | ICD-10-CM | POA: Diagnosis not present

## 2018-03-10 DIAGNOSIS — Z720 Tobacco use: Secondary | ICD-10-CM | POA: Diagnosis not present

## 2018-03-10 DIAGNOSIS — E7800 Pure hypercholesterolemia, unspecified: Secondary | ICD-10-CM | POA: Diagnosis not present

## 2018-03-10 NOTE — Patient Outreach (Signed)
[place] [facility]) Care Management  03/10/2018  Virgene Tirone [birth year] [id number]   Medication Adherence  call to Mrs. Jermani Pund spoke with patient she already order Simvastatin 10 mg and Amlodipine/ Benazepril 5/20 she will pick then up when she gets some money patient has medications until she picks them up. Mrs. Pitstick is showing past due under [place].   [place] Management Direct Dial [phone]  Fax [phone] [email or web address]

## 2018-03-17 DIAGNOSIS — K219 Gastro-esophageal reflux disease without esophagitis: Secondary | ICD-10-CM | POA: Diagnosis not present

## 2018-03-17 DIAGNOSIS — E7800 Pure hypercholesterolemia, unspecified: Secondary | ICD-10-CM | POA: Diagnosis not present

## 2018-03-17 DIAGNOSIS — Z720 Tobacco use: Secondary | ICD-10-CM | POA: Diagnosis not present

## 2018-03-17 DIAGNOSIS — I10 Essential (primary) hypertension: Secondary | ICD-10-CM | POA: Diagnosis not present

## 2018-03-17 DIAGNOSIS — R7303 Prediabetes: Secondary | ICD-10-CM | POA: Diagnosis not present

## 2018-03-28 DIAGNOSIS — M67272 Synovial hypertrophy, not elsewhere classified, left ankle and foot: Secondary | ICD-10-CM | POA: Diagnosis not present

## 2018-03-28 DIAGNOSIS — M6740 Ganglion, unspecified site: Secondary | ICD-10-CM | POA: Diagnosis not present

## 2018-03-28 DIAGNOSIS — R2242 Localized swelling, mass and lump, left lower limb: Secondary | ICD-10-CM | POA: Diagnosis not present

## 2018-03-28 DIAGNOSIS — M67472 Ganglion, left ankle and foot: Secondary | ICD-10-CM | POA: Diagnosis not present

## 2018-03-31 DIAGNOSIS — R2242 Localized swelling, mass and lump, left lower limb: Secondary | ICD-10-CM | POA: Diagnosis not present

## 2018-06-09 DIAGNOSIS — Z0001 Encounter for general adult medical examination with abnormal findings: Secondary | ICD-10-CM | POA: Diagnosis not present

## 2018-06-09 DIAGNOSIS — I10 Essential (primary) hypertension: Secondary | ICD-10-CM | POA: Diagnosis not present

## 2018-06-09 DIAGNOSIS — Z01118 Encounter for examination of ears and hearing with other abnormal findings: Secondary | ICD-10-CM | POA: Diagnosis not present

## 2018-06-09 DIAGNOSIS — R7303 Prediabetes: Secondary | ICD-10-CM | POA: Diagnosis not present

## 2018-06-09 DIAGNOSIS — Z131 Encounter for screening for diabetes mellitus: Secondary | ICD-10-CM | POA: Diagnosis not present

## 2018-06-09 DIAGNOSIS — Z1329 Encounter for screening for other suspected endocrine disorder: Secondary | ICD-10-CM | POA: Diagnosis not present

## 2018-06-09 DIAGNOSIS — K219 Gastro-esophageal reflux disease without esophagitis: Secondary | ICD-10-CM | POA: Diagnosis not present

## 2018-09-04 DIAGNOSIS — Z0000 Encounter for general adult medical examination without abnormal findings: Secondary | ICD-10-CM | POA: Diagnosis not present

## 2018-09-04 DIAGNOSIS — R7303 Prediabetes: Secondary | ICD-10-CM | POA: Diagnosis not present

## 2018-09-04 DIAGNOSIS — R300 Dysuria: Secondary | ICD-10-CM | POA: Diagnosis not present

## 2018-09-04 DIAGNOSIS — K219 Gastro-esophageal reflux disease without esophagitis: Secondary | ICD-10-CM | POA: Diagnosis not present

## 2018-09-04 DIAGNOSIS — I10 Essential (primary) hypertension: Secondary | ICD-10-CM | POA: Diagnosis not present

## 2018-09-04 DIAGNOSIS — E7800 Pure hypercholesterolemia, unspecified: Secondary | ICD-10-CM | POA: Diagnosis not present

## 2018-10-31 DIAGNOSIS — R7303 Prediabetes: Secondary | ICD-10-CM | POA: Diagnosis not present

## 2018-10-31 DIAGNOSIS — Z720 Tobacco use: Secondary | ICD-10-CM | POA: Diagnosis not present

## 2018-10-31 DIAGNOSIS — I10 Essential (primary) hypertension: Secondary | ICD-10-CM | POA: Diagnosis not present

## 2018-10-31 DIAGNOSIS — K219 Gastro-esophageal reflux disease without esophagitis: Secondary | ICD-10-CM | POA: Diagnosis not present

## 2018-10-31 DIAGNOSIS — E7800 Pure hypercholesterolemia, unspecified: Secondary | ICD-10-CM | POA: Diagnosis not present

## 2018-12-08 DIAGNOSIS — M25512 Pain in left shoulder: Secondary | ICD-10-CM | POA: Diagnosis not present

## 2018-12-08 DIAGNOSIS — M5442 Lumbago with sciatica, left side: Secondary | ICD-10-CM | POA: Diagnosis not present

## 2018-12-08 DIAGNOSIS — G8929 Other chronic pain: Secondary | ICD-10-CM | POA: Diagnosis not present

## 2018-12-08 DIAGNOSIS — M546 Pain in thoracic spine: Secondary | ICD-10-CM | POA: Diagnosis not present

## 2018-12-22 ENCOUNTER — Other Ambulatory Visit: Payer: Self-pay | Admitting: Physical Medicine and Rehabilitation

## 2018-12-22 DIAGNOSIS — R1903 Right lower quadrant abdominal swelling, mass and lump: Secondary | ICD-10-CM

## 2018-12-22 DIAGNOSIS — K219 Gastro-esophageal reflux disease without esophagitis: Secondary | ICD-10-CM | POA: Diagnosis not present

## 2018-12-22 DIAGNOSIS — I10 Essential (primary) hypertension: Secondary | ICD-10-CM | POA: Diagnosis not present

## 2018-12-22 DIAGNOSIS — E7800 Pure hypercholesterolemia, unspecified: Secondary | ICD-10-CM | POA: Diagnosis not present

## 2018-12-22 DIAGNOSIS — R7303 Prediabetes: Secondary | ICD-10-CM | POA: Diagnosis not present

## 2018-12-22 DIAGNOSIS — I719 Aortic aneurysm of unspecified site, without rupture: Secondary | ICD-10-CM

## 2018-12-25 ENCOUNTER — Other Ambulatory Visit: Payer: Self-pay | Admitting: Physical Medicine and Rehabilitation

## 2018-12-25 DIAGNOSIS — R1903 Right lower quadrant abdominal swelling, mass and lump: Secondary | ICD-10-CM

## 2019-01-02 ENCOUNTER — Ambulatory Visit
Admission: RE | Admit: 2019-01-02 | Discharge: 2019-01-02 | Disposition: A | Discharge status: Home / Self Care | Payer: Medicare Other | Source: Ambulatory Visit | Attending: Physical Medicine and Rehabilitation | Admitting: Physical Medicine and Rehabilitation

## 2019-01-02 DIAGNOSIS — R1903 Right lower quadrant abdominal swelling, mass and lump: Secondary | ICD-10-CM

## 2019-01-02 DIAGNOSIS — N289 Disorder of kidney and ureter, unspecified: Secondary | ICD-10-CM | POA: Diagnosis not present

## 2019-01-02 MED ORDER — IOPAMIDOL (ISOVUE-370) INJECTION 76%
100.0000 mL | Freq: Once | INTRAVENOUS | Status: AC | PRN
  Administered 2019-01-02: 100 mL via INTRAVENOUS

## 2019-01-15 ENCOUNTER — Ambulatory Visit
Admission: RE | Admit: 2019-01-15 | Discharge: 2019-01-15 | Disposition: A | Discharge status: Home / Self Care | Payer: Medicare Other | Source: Ambulatory Visit | Attending: Physical Medicine and Rehabilitation | Admitting: Physical Medicine and Rehabilitation

## 2019-01-15 DIAGNOSIS — I719 Aortic aneurysm of unspecified site, without rupture: Secondary | ICD-10-CM

## 2019-01-19 ENCOUNTER — Other Ambulatory Visit: Payer: Self-pay

## 2019-01-19 NOTE — Patient Outreach (Signed)
[place] [facility]) Care Management  01/19/2019  Kaileena Minaya 1951/02/18 [id number]   Medication Adherence call to Mrs. Betsey Holiday [facility] Ident[employer] Verify spoke with patient she is past due on Simvastatin 10 mg and Amlodipine/Benazepril 5/20 mg patient explain she takes 1 tablet daily and has already place an order thru [facility] patient will pick up today.Mrs. Mesecher is showing past due under [place].   [place] Management Direct Dial [phone]  Fax [phone] [email or web address]

## 2019-01-26 DIAGNOSIS — M25512 Pain in left shoulder: Secondary | ICD-10-CM | POA: Diagnosis not present

## 2019-01-26 DIAGNOSIS — E876 Hypokalemia: Secondary | ICD-10-CM | POA: Diagnosis not present

## 2019-02-24 ENCOUNTER — Other Ambulatory Visit: Payer: Self-pay

## 2019-02-24 NOTE — Patient Outreach (Signed)
[place] [facility]) Care Management  02/24/2019  Robin Garner 1950/09/06 [id number]   Medication Adherence call to Robin Garner [employer] Ident[employer] Verify spoke with patient she is past due on Simvastatin 10 mg,patient explain she takes 1 tablet daily,patient has enough for 2 weeks and then she will order she explain she does not have the funds to pick up now,she is on a fix income.but said she will try t pick before then,Robin Garner is showing past due under [place].   [place] Management Direct Dial [phone]  Fax [phone] [email or web address]

## 2019-03-23 DIAGNOSIS — J449 Chronic obstructive pulmonary disease, unspecified: Secondary | ICD-10-CM | POA: Diagnosis not present

## 2019-03-23 DIAGNOSIS — I10 Essential (primary) hypertension: Secondary | ICD-10-CM | POA: Diagnosis not present

## 2019-03-23 DIAGNOSIS — E876 Hypokalemia: Secondary | ICD-10-CM | POA: Diagnosis not present

## 2019-03-23 DIAGNOSIS — K219 Gastro-esophageal reflux disease without esophagitis: Secondary | ICD-10-CM | POA: Diagnosis not present

## 2019-03-23 DIAGNOSIS — E7800 Pure hypercholesterolemia, unspecified: Secondary | ICD-10-CM | POA: Diagnosis not present

## 2019-06-29 DIAGNOSIS — Z136 Encounter for screening for cardiovascular disorders: Secondary | ICD-10-CM | POA: Diagnosis not present

## 2019-06-29 DIAGNOSIS — E559 Vitamin D deficiency, unspecified: Secondary | ICD-10-CM | POA: Diagnosis not present

## 2019-06-29 DIAGNOSIS — Z1329 Encounter for screening for other suspected endocrine disorder: Secondary | ICD-10-CM | POA: Diagnosis not present

## 2019-06-29 DIAGNOSIS — E7800 Pure hypercholesterolemia, unspecified: Secondary | ICD-10-CM | POA: Diagnosis not present

## 2019-06-29 DIAGNOSIS — J449 Chronic obstructive pulmonary disease, unspecified: Secondary | ICD-10-CM | POA: Diagnosis not present

## 2019-06-29 DIAGNOSIS — Z0001 Encounter for general adult medical examination with abnormal findings: Secondary | ICD-10-CM | POA: Diagnosis not present

## 2019-06-29 DIAGNOSIS — Z131 Encounter for screening for diabetes mellitus: Secondary | ICD-10-CM | POA: Diagnosis not present

## 2019-06-29 DIAGNOSIS — I10 Essential (primary) hypertension: Secondary | ICD-10-CM | POA: Diagnosis not present

## 2019-09-10 DIAGNOSIS — Z0001 Encounter for general adult medical examination with abnormal findings: Secondary | ICD-10-CM | POA: Diagnosis not present

## 2019-09-10 DIAGNOSIS — J449 Chronic obstructive pulmonary disease, unspecified: Secondary | ICD-10-CM | POA: Diagnosis not present

## 2019-09-10 DIAGNOSIS — I10 Essential (primary) hypertension: Secondary | ICD-10-CM | POA: Diagnosis not present

## 2019-09-10 DIAGNOSIS — E7800 Pure hypercholesterolemia, unspecified: Secondary | ICD-10-CM | POA: Diagnosis not present

## 2019-09-10 DIAGNOSIS — R0989 Other specified symptoms and signs involving the circulatory and respiratory systems: Secondary | ICD-10-CM | POA: Diagnosis not present

## 2019-09-28 DIAGNOSIS — I6523 Occlusion and stenosis of bilateral carotid arteries: Secondary | ICD-10-CM | POA: Diagnosis not present

## 2019-09-28 DIAGNOSIS — R0989 Other specified symptoms and signs involving the circulatory and respiratory systems: Secondary | ICD-10-CM | POA: Diagnosis not present

## 2019-09-28 DIAGNOSIS — Z7982 Long term (current) use of aspirin: Secondary | ICD-10-CM | POA: Diagnosis not present

## 2019-10-01 DIAGNOSIS — H04123 Dry eye syndrome of bilateral lacrimal glands: Secondary | ICD-10-CM | POA: Diagnosis not present

## 2019-10-01 DIAGNOSIS — H02889 Meibomian gland dysfunction of unspecified eye, unspecified eyelid: Secondary | ICD-10-CM | POA: Diagnosis not present

## 2019-10-02 DIAGNOSIS — Z5181 Encounter for therapeutic drug level monitoring: Secondary | ICD-10-CM | POA: Diagnosis not present

## 2019-10-02 DIAGNOSIS — M19012 Primary osteoarthritis, left shoulder: Secondary | ICD-10-CM | POA: Diagnosis not present

## 2019-10-02 DIAGNOSIS — M545 Low back pain: Secondary | ICD-10-CM | POA: Diagnosis not present

## 2019-10-02 DIAGNOSIS — M25512 Pain in left shoulder: Secondary | ICD-10-CM | POA: Diagnosis not present

## 2019-10-02 DIAGNOSIS — M5136 Other intervertebral disc degeneration, lumbar region: Secondary | ICD-10-CM | POA: Diagnosis not present

## 2019-10-02 DIAGNOSIS — Z79891 Long term (current) use of opiate analgesic: Secondary | ICD-10-CM | POA: Diagnosis not present

## 2019-10-02 DIAGNOSIS — M5137 Other intervertebral disc degeneration, lumbosacral region: Secondary | ICD-10-CM | POA: Diagnosis not present

## 2019-10-02 DIAGNOSIS — G8929 Other chronic pain: Secondary | ICD-10-CM | POA: Diagnosis not present

## 2019-10-16 DIAGNOSIS — I708 Atherosclerosis of other arteries: Secondary | ICD-10-CM | POA: Diagnosis not present

## 2019-10-16 DIAGNOSIS — I728 Aneurysm of other specified arteries: Secondary | ICD-10-CM | POA: Diagnosis not present

## 2019-10-16 DIAGNOSIS — I63233 Cerebral infarction due to unspecified occlusion or stenosis of bilateral carotid arteries: Secondary | ICD-10-CM | POA: Diagnosis not present

## 2019-10-16 DIAGNOSIS — Z7902 Long term (current) use of antithrombotics/antiplatelets: Secondary | ICD-10-CM | POA: Diagnosis not present

## 2019-10-16 DIAGNOSIS — I6522 Occlusion and stenosis of left carotid artery: Secondary | ICD-10-CM | POA: Diagnosis not present

## 2019-10-16 DIAGNOSIS — I6503 Occlusion and stenosis of bilateral vertebral arteries: Secondary | ICD-10-CM | POA: Diagnosis not present

## 2019-10-16 DIAGNOSIS — I672 Cerebral atherosclerosis: Secondary | ICD-10-CM | POA: Diagnosis not present

## 2019-10-16 DIAGNOSIS — J439 Emphysema, unspecified: Secondary | ICD-10-CM | POA: Diagnosis not present

## 2019-11-11 DIAGNOSIS — G8929 Other chronic pain: Secondary | ICD-10-CM | POA: Diagnosis not present

## 2019-11-11 DIAGNOSIS — M545 Low back pain: Secondary | ICD-10-CM | POA: Diagnosis not present

## 2019-11-11 DIAGNOSIS — M2550 Pain in unspecified joint: Secondary | ICD-10-CM | POA: Diagnosis not present

## 2019-11-11 DIAGNOSIS — G894 Chronic pain syndrome: Secondary | ICD-10-CM | POA: Diagnosis not present

## 2019-11-11 DIAGNOSIS — M25512 Pain in left shoulder: Secondary | ICD-10-CM | POA: Diagnosis not present

## 2019-11-12 DIAGNOSIS — Z01810 Encounter for preprocedural cardiovascular examination: Secondary | ICD-10-CM | POA: Diagnosis not present

## 2019-11-12 DIAGNOSIS — R001 Bradycardia, unspecified: Secondary | ICD-10-CM | POA: Diagnosis not present

## 2019-11-12 DIAGNOSIS — I440 Atrioventricular block, first degree: Secondary | ICD-10-CM | POA: Diagnosis not present

## 2019-11-12 DIAGNOSIS — I517 Cardiomegaly: Secondary | ICD-10-CM | POA: Diagnosis not present

## 2019-11-12 DIAGNOSIS — I6521 Occlusion and stenosis of right carotid artery: Secondary | ICD-10-CM | POA: Diagnosis not present

## 2019-11-12 DIAGNOSIS — Z01812 Encounter for preprocedural laboratory examination: Secondary | ICD-10-CM | POA: Diagnosis not present

## 2019-11-19 DIAGNOSIS — Z7902 Long term (current) use of antithrombotics/antiplatelets: Secondary | ICD-10-CM | POA: Diagnosis not present

## 2019-11-19 DIAGNOSIS — I6521 Occlusion and stenosis of right carotid artery: Secondary | ICD-10-CM | POA: Diagnosis not present

## 2019-11-19 DIAGNOSIS — J45909 Unspecified asthma, uncomplicated: Secondary | ICD-10-CM | POA: Diagnosis not present

## 2019-11-19 DIAGNOSIS — E876 Hypokalemia: Secondary | ICD-10-CM | POA: Diagnosis not present

## 2019-11-19 DIAGNOSIS — I10 Essential (primary) hypertension: Secondary | ICD-10-CM | POA: Diagnosis not present

## 2019-11-19 DIAGNOSIS — M1990 Unspecified osteoarthritis, unspecified site: Secondary | ICD-10-CM | POA: Diagnosis not present

## 2019-11-19 DIAGNOSIS — Z7982 Long term (current) use of aspirin: Secondary | ICD-10-CM | POA: Diagnosis not present

## 2019-11-19 DIAGNOSIS — K219 Gastro-esophageal reflux disease without esophagitis: Secondary | ICD-10-CM | POA: Diagnosis not present

## 2019-12-23 DIAGNOSIS — I6523 Occlusion and stenosis of bilateral carotid arteries: Secondary | ICD-10-CM | POA: Diagnosis not present

## 2019-12-23 DIAGNOSIS — Z9889 Other specified postprocedural states: Secondary | ICD-10-CM | POA: Diagnosis not present

## 2020-01-07 DIAGNOSIS — Z79891 Long term (current) use of opiate analgesic: Secondary | ICD-10-CM | POA: Diagnosis not present

## 2020-01-07 DIAGNOSIS — M47816 Spondylosis without myelopathy or radiculopathy, lumbar region: Secondary | ICD-10-CM | POA: Diagnosis not present

## 2020-01-07 DIAGNOSIS — Z5181 Encounter for therapeutic drug level monitoring: Secondary | ICD-10-CM | POA: Diagnosis not present

## 2020-01-07 DIAGNOSIS — M2550 Pain in unspecified joint: Secondary | ICD-10-CM | POA: Diagnosis not present

## 2020-01-12 DIAGNOSIS — I10 Essential (primary) hypertension: Secondary | ICD-10-CM | POA: Diagnosis not present

## 2020-01-12 DIAGNOSIS — R7303 Prediabetes: Secondary | ICD-10-CM | POA: Diagnosis not present

## 2020-01-12 DIAGNOSIS — E7800 Pure hypercholesterolemia, unspecified: Secondary | ICD-10-CM | POA: Diagnosis not present

## 2020-01-12 DIAGNOSIS — I6521 Occlusion and stenosis of right carotid artery: Secondary | ICD-10-CM | POA: Diagnosis not present

## 2020-01-12 DIAGNOSIS — K219 Gastro-esophageal reflux disease without esophagitis: Secondary | ICD-10-CM | POA: Diagnosis not present

## 2020-01-12 DIAGNOSIS — Z23 Encounter for immunization: Secondary | ICD-10-CM | POA: Diagnosis not present

## 2020-01-18 DIAGNOSIS — G9389 Other specified disorders of brain: Secondary | ICD-10-CM | POA: Diagnosis not present

## 2020-01-18 DIAGNOSIS — I6503 Occlusion and stenosis of bilateral vertebral arteries: Secondary | ICD-10-CM | POA: Diagnosis not present

## 2020-01-18 DIAGNOSIS — I6522 Occlusion and stenosis of left carotid artery: Secondary | ICD-10-CM | POA: Diagnosis not present

## 2020-01-18 DIAGNOSIS — Z1231 Encounter for screening mammogram for malignant neoplasm of breast: Secondary | ICD-10-CM | POA: Diagnosis not present

## 2020-01-18 DIAGNOSIS — I6782 Cerebral ischemia: Secondary | ICD-10-CM | POA: Diagnosis not present

## 2020-01-18 DIAGNOSIS — I6523 Occlusion and stenosis of bilateral carotid arteries: Secondary | ICD-10-CM | POA: Diagnosis not present

## 2020-01-18 DIAGNOSIS — I672 Cerebral atherosclerosis: Secondary | ICD-10-CM | POA: Diagnosis not present

## 2020-01-19 DIAGNOSIS — E7800 Pure hypercholesterolemia, unspecified: Secondary | ICD-10-CM | POA: Diagnosis not present

## 2020-01-19 DIAGNOSIS — K219 Gastro-esophageal reflux disease without esophagitis: Secondary | ICD-10-CM | POA: Diagnosis not present

## 2020-01-19 DIAGNOSIS — I10 Essential (primary) hypertension: Secondary | ICD-10-CM | POA: Diagnosis not present

## 2020-01-19 DIAGNOSIS — R7303 Prediabetes: Secondary | ICD-10-CM | POA: Diagnosis not present

## 2020-02-04 DIAGNOSIS — Z5181 Encounter for therapeutic drug level monitoring: Secondary | ICD-10-CM | POA: Diagnosis not present

## 2020-02-04 DIAGNOSIS — Z79891 Long term (current) use of opiate analgesic: Secondary | ICD-10-CM | POA: Diagnosis not present

## 2020-02-04 DIAGNOSIS — M47816 Spondylosis without myelopathy or radiculopathy, lumbar region: Secondary | ICD-10-CM | POA: Diagnosis not present

## 2020-02-19 DIAGNOSIS — E7800 Pure hypercholesterolemia, unspecified: Secondary | ICD-10-CM | POA: Diagnosis not present

## 2020-02-19 DIAGNOSIS — R7303 Prediabetes: Secondary | ICD-10-CM | POA: Diagnosis not present

## 2020-02-19 DIAGNOSIS — I10 Essential (primary) hypertension: Secondary | ICD-10-CM | POA: Diagnosis not present

## 2020-02-19 DIAGNOSIS — K219 Gastro-esophageal reflux disease without esophagitis: Secondary | ICD-10-CM | POA: Diagnosis not present

## 2020-03-08 DIAGNOSIS — L84 Corns and callosities: Secondary | ICD-10-CM | POA: Diagnosis not present

## 2020-03-08 DIAGNOSIS — M25572 Pain in left ankle and joints of left foot: Secondary | ICD-10-CM | POA: Diagnosis not present

## 2020-04-13 DIAGNOSIS — Z79891 Long term (current) use of opiate analgesic: Secondary | ICD-10-CM | POA: Diagnosis not present

## 2020-04-13 DIAGNOSIS — M2550 Pain in unspecified joint: Secondary | ICD-10-CM | POA: Diagnosis not present

## 2020-04-13 DIAGNOSIS — M47816 Spondylosis without myelopathy or radiculopathy, lumbar region: Secondary | ICD-10-CM | POA: Diagnosis not present

## 2020-04-13 DIAGNOSIS — M25512 Pain in left shoulder: Secondary | ICD-10-CM | POA: Diagnosis not present

## 2020-04-13 DIAGNOSIS — Z5181 Encounter for therapeutic drug level monitoring: Secondary | ICD-10-CM | POA: Diagnosis not present

## 2020-04-13 DIAGNOSIS — G8929 Other chronic pain: Secondary | ICD-10-CM | POA: Diagnosis not present

## 2020-04-21 DIAGNOSIS — R922 Inconclusive mammogram: Secondary | ICD-10-CM | POA: Diagnosis not present

## 2020-04-21 DIAGNOSIS — N6489 Other specified disorders of breast: Secondary | ICD-10-CM | POA: Diagnosis not present

## 2020-04-21 DIAGNOSIS — R928 Other abnormal and inconclusive findings on diagnostic imaging of breast: Secondary | ICD-10-CM | POA: Diagnosis not present

## 2020-07-13 DIAGNOSIS — G894 Chronic pain syndrome: Secondary | ICD-10-CM | POA: Diagnosis not present

## 2020-07-13 DIAGNOSIS — M2550 Pain in unspecified joint: Secondary | ICD-10-CM | POA: Diagnosis not present

## 2020-07-13 DIAGNOSIS — M47816 Spondylosis without myelopathy or radiculopathy, lumbar region: Secondary | ICD-10-CM | POA: Diagnosis not present

## 2020-07-19 DIAGNOSIS — E559 Vitamin D deficiency, unspecified: Secondary | ICD-10-CM | POA: Diagnosis not present

## 2020-07-19 DIAGNOSIS — K219 Gastro-esophageal reflux disease without esophagitis: Secondary | ICD-10-CM | POA: Diagnosis not present

## 2020-07-19 DIAGNOSIS — R7303 Prediabetes: Secondary | ICD-10-CM | POA: Diagnosis not present

## 2020-07-19 DIAGNOSIS — Z720 Tobacco use: Secondary | ICD-10-CM | POA: Diagnosis not present

## 2020-07-19 DIAGNOSIS — Z0001 Encounter for general adult medical examination with abnormal findings: Secondary | ICD-10-CM | POA: Diagnosis not present

## 2020-07-19 DIAGNOSIS — G629 Polyneuropathy, unspecified: Secondary | ICD-10-CM | POA: Diagnosis not present

## 2020-07-19 DIAGNOSIS — I10 Essential (primary) hypertension: Secondary | ICD-10-CM | POA: Diagnosis not present

## 2020-07-19 DIAGNOSIS — E7800 Pure hypercholesterolemia, unspecified: Secondary | ICD-10-CM | POA: Diagnosis not present

## 2020-07-19 DIAGNOSIS — J449 Chronic obstructive pulmonary disease, unspecified: Secondary | ICD-10-CM | POA: Diagnosis not present

## 2020-08-10 DIAGNOSIS — R7303 Prediabetes: Secondary | ICD-10-CM | POA: Diagnosis not present

## 2020-08-10 DIAGNOSIS — K219 Gastro-esophageal reflux disease without esophagitis: Secondary | ICD-10-CM | POA: Diagnosis not present

## 2020-08-10 DIAGNOSIS — J449 Chronic obstructive pulmonary disease, unspecified: Secondary | ICD-10-CM | POA: Diagnosis not present

## 2020-08-10 DIAGNOSIS — E559 Vitamin D deficiency, unspecified: Secondary | ICD-10-CM | POA: Diagnosis not present

## 2020-08-10 DIAGNOSIS — E876 Hypokalemia: Secondary | ICD-10-CM | POA: Diagnosis not present

## 2020-08-10 DIAGNOSIS — G629 Polyneuropathy, unspecified: Secondary | ICD-10-CM | POA: Diagnosis not present

## 2020-08-10 DIAGNOSIS — I10 Essential (primary) hypertension: Secondary | ICD-10-CM | POA: Diagnosis not present

## 2020-08-10 DIAGNOSIS — E7800 Pure hypercholesterolemia, unspecified: Secondary | ICD-10-CM | POA: Diagnosis not present

## 2020-08-10 DIAGNOSIS — Z720 Tobacco use: Secondary | ICD-10-CM | POA: Diagnosis not present

## 2020-08-31 DIAGNOSIS — K219 Gastro-esophageal reflux disease without esophagitis: Secondary | ICD-10-CM | POA: Diagnosis not present

## 2020-08-31 DIAGNOSIS — I10 Essential (primary) hypertension: Secondary | ICD-10-CM | POA: Diagnosis not present

## 2020-08-31 DIAGNOSIS — J449 Chronic obstructive pulmonary disease, unspecified: Secondary | ICD-10-CM | POA: Diagnosis not present

## 2020-08-31 DIAGNOSIS — Z720 Tobacco use: Secondary | ICD-10-CM | POA: Diagnosis not present

## 2020-08-31 DIAGNOSIS — R7303 Prediabetes: Secondary | ICD-10-CM | POA: Diagnosis not present

## 2020-08-31 DIAGNOSIS — E7800 Pure hypercholesterolemia, unspecified: Secondary | ICD-10-CM | POA: Diagnosis not present

## 2020-08-31 DIAGNOSIS — E559 Vitamin D deficiency, unspecified: Secondary | ICD-10-CM | POA: Diagnosis not present

## 2020-08-31 DIAGNOSIS — N179 Acute kidney failure, unspecified: Secondary | ICD-10-CM | POA: Diagnosis not present

## 2020-10-11 DIAGNOSIS — J418 Mixed simple and mucopurulent chronic bronchitis: Secondary | ICD-10-CM | POA: Diagnosis not present

## 2020-10-11 DIAGNOSIS — K219 Gastro-esophageal reflux disease without esophagitis: Secondary | ICD-10-CM | POA: Diagnosis not present

## 2020-10-11 DIAGNOSIS — R7303 Prediabetes: Secondary | ICD-10-CM | POA: Diagnosis not present

## 2020-10-11 DIAGNOSIS — E876 Hypokalemia: Secondary | ICD-10-CM | POA: Diagnosis not present

## 2020-10-11 DIAGNOSIS — R051 Acute cough: Secondary | ICD-10-CM | POA: Diagnosis not present

## 2020-10-11 DIAGNOSIS — E7800 Pure hypercholesterolemia, unspecified: Secondary | ICD-10-CM | POA: Diagnosis not present

## 2020-10-11 DIAGNOSIS — I10 Essential (primary) hypertension: Secondary | ICD-10-CM | POA: Diagnosis not present

## 2020-10-11 DIAGNOSIS — Z0001 Encounter for general adult medical examination with abnormal findings: Secondary | ICD-10-CM | POA: Diagnosis not present

## 2020-10-11 DIAGNOSIS — Z720 Tobacco use: Secondary | ICD-10-CM | POA: Diagnosis not present

## 2020-10-14 DIAGNOSIS — M2550 Pain in unspecified joint: Secondary | ICD-10-CM | POA: Diagnosis not present

## 2020-10-14 DIAGNOSIS — Z79891 Long term (current) use of opiate analgesic: Secondary | ICD-10-CM | POA: Diagnosis not present

## 2020-10-14 DIAGNOSIS — M47816 Spondylosis without myelopathy or radiculopathy, lumbar region: Secondary | ICD-10-CM | POA: Diagnosis not present

## 2020-10-14 DIAGNOSIS — Z5181 Encounter for therapeutic drug level monitoring: Secondary | ICD-10-CM | POA: Diagnosis not present

## 2020-10-14 DIAGNOSIS — G894 Chronic pain syndrome: Secondary | ICD-10-CM | POA: Diagnosis not present

## 2020-10-17 DIAGNOSIS — Z79891 Long term (current) use of opiate analgesic: Secondary | ICD-10-CM | POA: Diagnosis not present

## 2020-10-17 DIAGNOSIS — M47816 Spondylosis without myelopathy or radiculopathy, lumbar region: Secondary | ICD-10-CM | POA: Diagnosis not present

## 2020-10-17 DIAGNOSIS — M2550 Pain in unspecified joint: Secondary | ICD-10-CM | POA: Diagnosis not present

## 2020-10-17 DIAGNOSIS — G894 Chronic pain syndrome: Secondary | ICD-10-CM | POA: Diagnosis not present

## 2020-10-17 DIAGNOSIS — Z5181 Encounter for therapeutic drug level monitoring: Secondary | ICD-10-CM | POA: Diagnosis not present

## 2020-12-08 DIAGNOSIS — K219 Gastro-esophageal reflux disease without esophagitis: Secondary | ICD-10-CM | POA: Diagnosis not present

## 2020-12-08 DIAGNOSIS — J418 Mixed simple and mucopurulent chronic bronchitis: Secondary | ICD-10-CM | POA: Diagnosis not present

## 2020-12-08 DIAGNOSIS — I10 Essential (primary) hypertension: Secondary | ICD-10-CM | POA: Diagnosis not present

## 2020-12-08 DIAGNOSIS — I499 Cardiac arrhythmia, unspecified: Secondary | ICD-10-CM | POA: Diagnosis not present

## 2020-12-08 DIAGNOSIS — I6521 Occlusion and stenosis of right carotid artery: Secondary | ICD-10-CM | POA: Diagnosis not present

## 2020-12-08 DIAGNOSIS — Z131 Encounter for screening for diabetes mellitus: Secondary | ICD-10-CM | POA: Diagnosis not present

## 2020-12-08 DIAGNOSIS — Z9889 Other specified postprocedural states: Secondary | ICD-10-CM | POA: Diagnosis not present

## 2020-12-08 DIAGNOSIS — Z720 Tobacco use: Secondary | ICD-10-CM | POA: Diagnosis not present

## 2020-12-08 DIAGNOSIS — E782 Mixed hyperlipidemia: Secondary | ICD-10-CM | POA: Diagnosis not present

## 2020-12-08 DIAGNOSIS — M47816 Spondylosis without myelopathy or radiculopathy, lumbar region: Secondary | ICD-10-CM | POA: Diagnosis not present

## 2020-12-08 DIAGNOSIS — G894 Chronic pain syndrome: Secondary | ICD-10-CM | POA: Diagnosis not present

## 2020-12-20 DIAGNOSIS — Z5181 Encounter for therapeutic drug level monitoring: Secondary | ICD-10-CM | POA: Diagnosis not present

## 2020-12-20 DIAGNOSIS — G8929 Other chronic pain: Secondary | ICD-10-CM | POA: Diagnosis not present

## 2020-12-20 DIAGNOSIS — Z79899 Other long term (current) drug therapy: Secondary | ICD-10-CM | POA: Diagnosis not present

## 2020-12-20 DIAGNOSIS — Z7982 Long term (current) use of aspirin: Secondary | ICD-10-CM | POA: Diagnosis not present

## 2020-12-20 DIAGNOSIS — M47816 Spondylosis without myelopathy or radiculopathy, lumbar region: Secondary | ICD-10-CM | POA: Diagnosis not present

## 2020-12-20 DIAGNOSIS — M533 Sacrococcygeal disorders, not elsewhere classified: Secondary | ICD-10-CM | POA: Diagnosis not present

## 2020-12-22 DIAGNOSIS — Z9109 Other allergy status, other than to drugs and biological substances: Secondary | ICD-10-CM | POA: Diagnosis not present

## 2020-12-22 DIAGNOSIS — Z7722 Contact with and (suspected) exposure to environmental tobacco smoke (acute) (chronic): Secondary | ICD-10-CM | POA: Diagnosis not present

## 2020-12-22 DIAGNOSIS — J449 Chronic obstructive pulmonary disease, unspecified: Secondary | ICD-10-CM | POA: Diagnosis not present

## 2020-12-22 DIAGNOSIS — Z87891 Personal history of nicotine dependence: Secondary | ICD-10-CM | POA: Diagnosis not present

## 2021-01-03 DIAGNOSIS — Z79891 Long term (current) use of opiate analgesic: Secondary | ICD-10-CM | POA: Diagnosis not present

## 2021-01-03 DIAGNOSIS — M533 Sacrococcygeal disorders, not elsewhere classified: Secondary | ICD-10-CM | POA: Diagnosis not present

## 2021-01-03 DIAGNOSIS — M461 Sacroiliitis, not elsewhere classified: Secondary | ICD-10-CM | POA: Diagnosis not present

## 2021-01-26 DIAGNOSIS — J449 Chronic obstructive pulmonary disease, unspecified: Secondary | ICD-10-CM | POA: Diagnosis not present

## 2021-01-26 DIAGNOSIS — Z23 Encounter for immunization: Secondary | ICD-10-CM | POA: Diagnosis not present

## 2021-01-26 DIAGNOSIS — E876 Hypokalemia: Secondary | ICD-10-CM | POA: Diagnosis not present

## 2021-01-31 DIAGNOSIS — M461 Sacroiliitis, not elsewhere classified: Secondary | ICD-10-CM | POA: Diagnosis not present

## 2021-01-31 DIAGNOSIS — Z79891 Long term (current) use of opiate analgesic: Secondary | ICD-10-CM | POA: Diagnosis not present

## 2021-01-31 DIAGNOSIS — M533 Sacrococcygeal disorders, not elsewhere classified: Secondary | ICD-10-CM | POA: Diagnosis not present

## 2021-04-26 IMAGING — CT CT ABD-PELV W/ CM
2 of 5 series · 14 of 46 positions shown, 16 images · IV contrast (isovue)
Comparison: Radiograph 10/07/2014

CLINICAL DATA: Labs drawn @ 315 c-.8, g-87 100ml isovue 300 Rlq
mass accidentally found having an xray of back. Low back pain. H/o
fibroids. H/o Cholecystectomy and fibroids removed No h/o ca Current
smoker^100mL 16M2B6-9NP IOPAMIDOL (16M2B6-9NP) INJECTION 76%

EXAM:
CT ABDOMEN AND PELVIS WITH CONTRAST
TECHNIQUE: Multidetector CT imaging of the abdomen and pelvis was performed
using the standard protocol following bolus administration of
intravenous contrast.
CONTRAST:  100mL 16M2B6-9NP IOPAMIDOL (16M2B6-9NP) INJECTION 76%

[Series 2: abd pelvis 5.00 br40 s3 axial · axial · 0.57mm/px · z∈[+1063,+1443]mm · 11 of 84 slices shown, 13 images]
[im 4/84  soft-tissue]
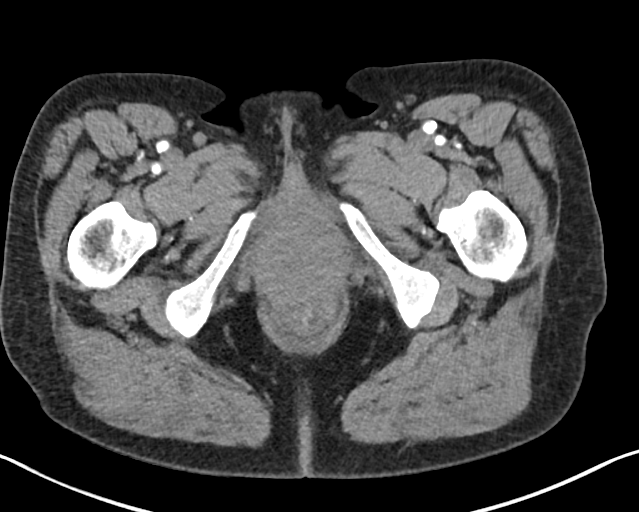
[im 4/84  bone]
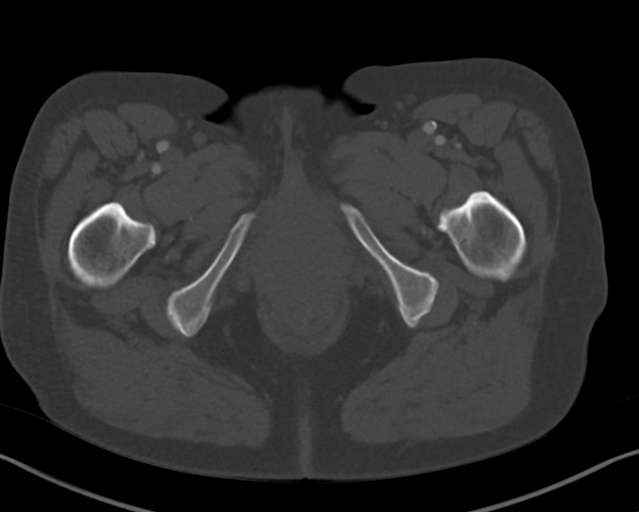
[im 12/84  soft-tissue]
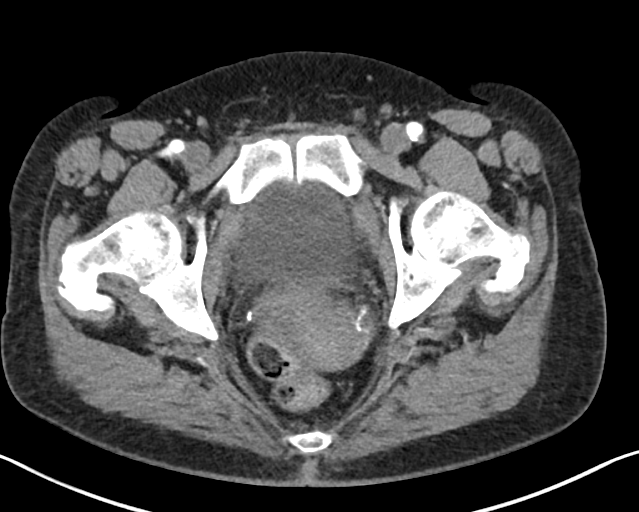
[im 20/84  soft-tissue]
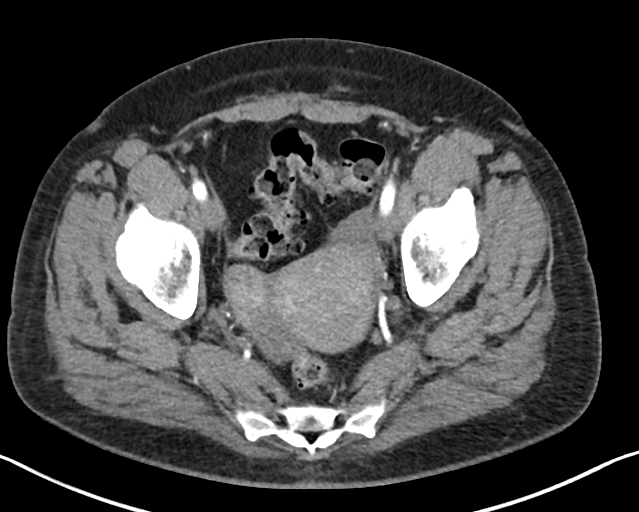
[im 28/84  soft-tissue]
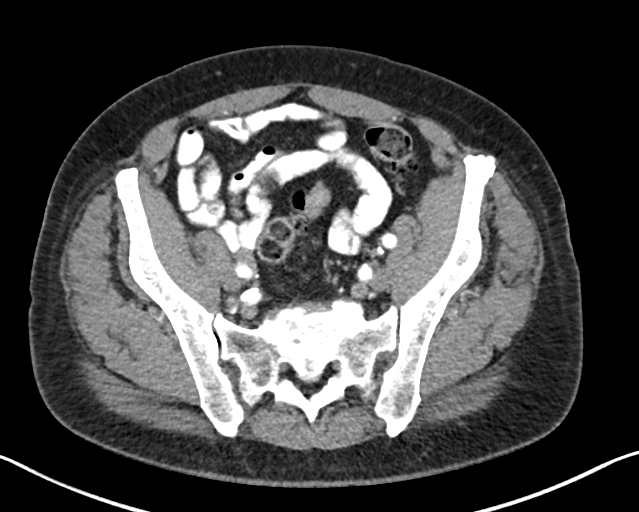
[im 36/84  soft-tissue]
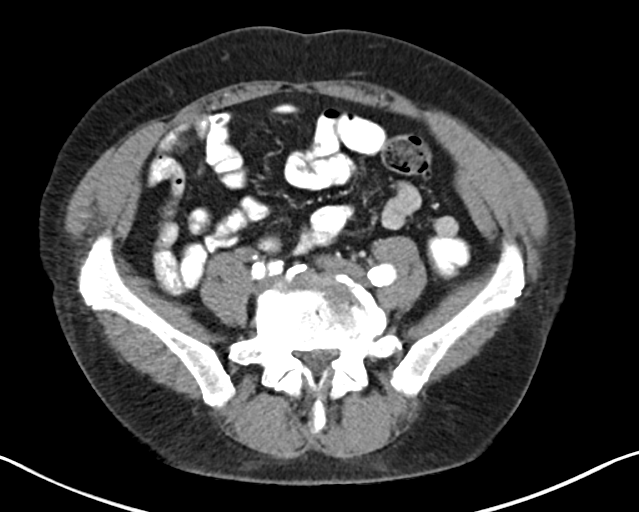
[im 44/84  soft-tissue]
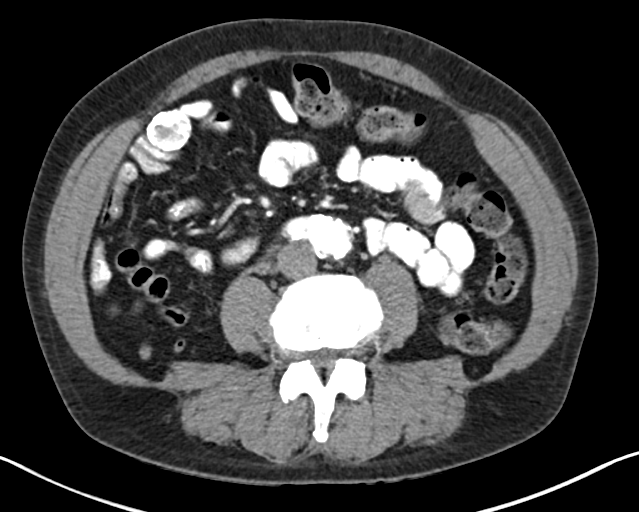
[im 48/84  soft-tissue]
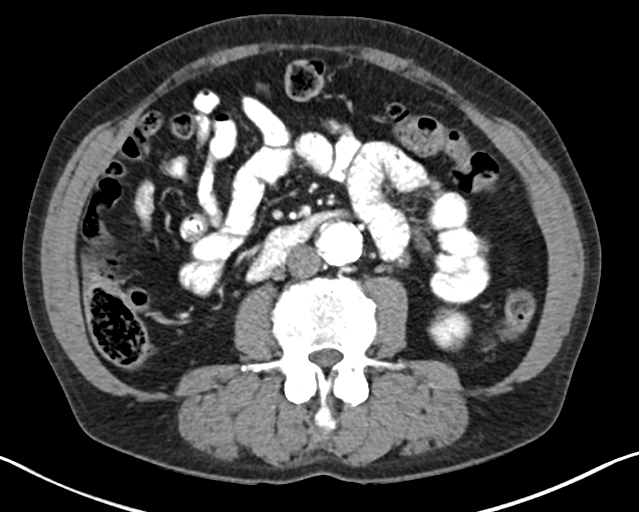
[im 56/84  soft-tissue]
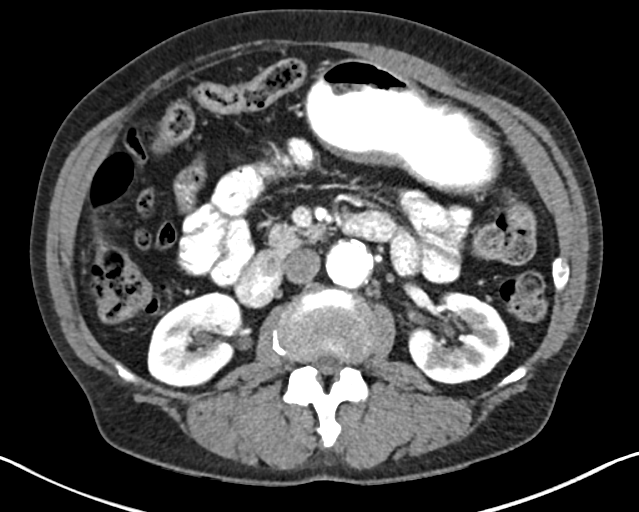
[im 64/84  soft-tissue]
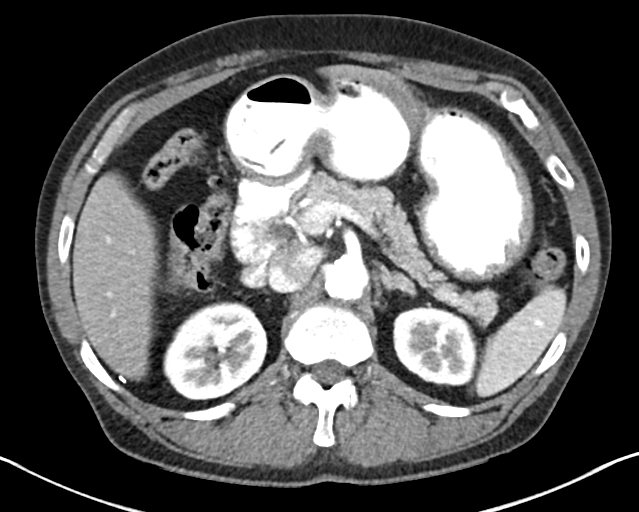
[im 64/84  bone]
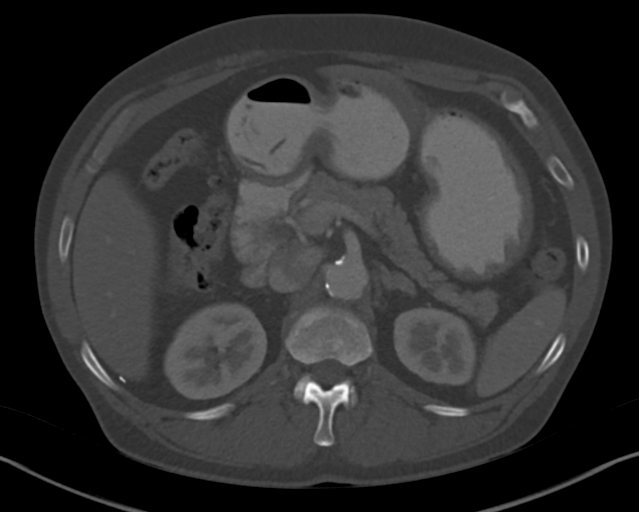
[im 72/84  soft-tissue]
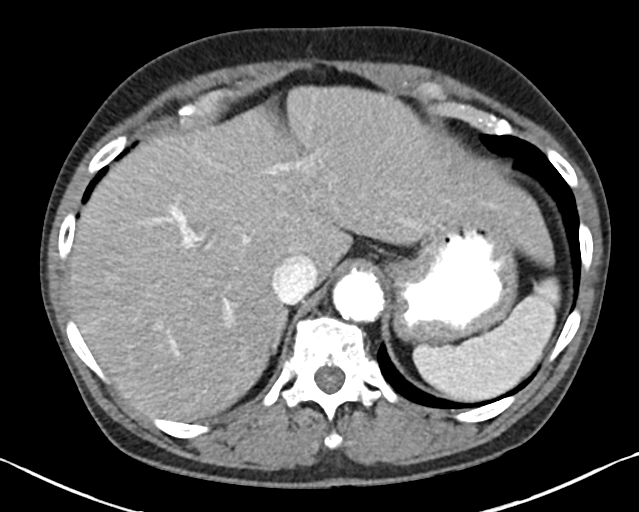
[im 80/84  soft-tissue]
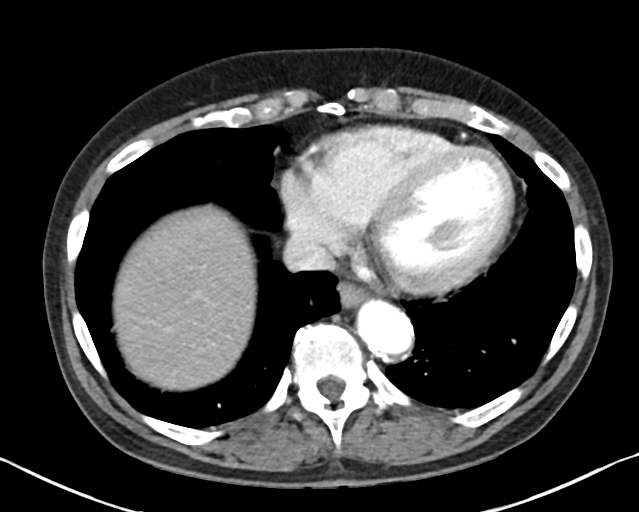

[Series 6: abd pelvis 2.00 br40 s3 cor · coronal · 0.69mm/px · 3 of 136 slices shown]
[im 46/136  soft-tissue]
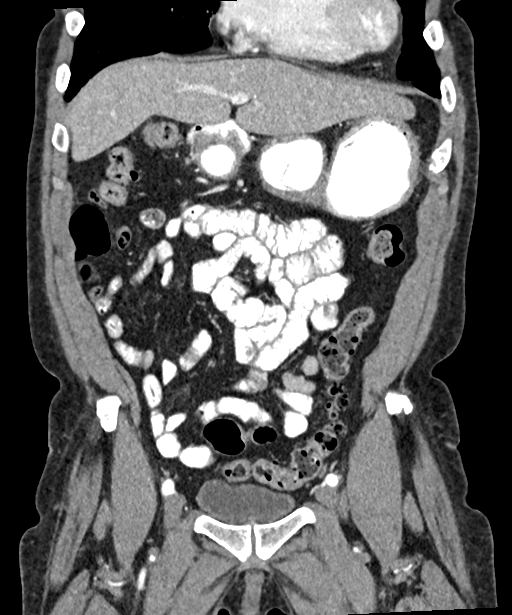
[im 61/136  soft-tissue]
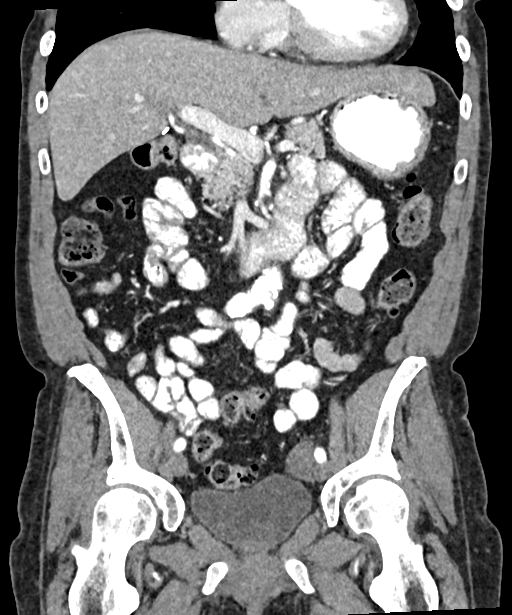
[im 76/136  soft-tissue]
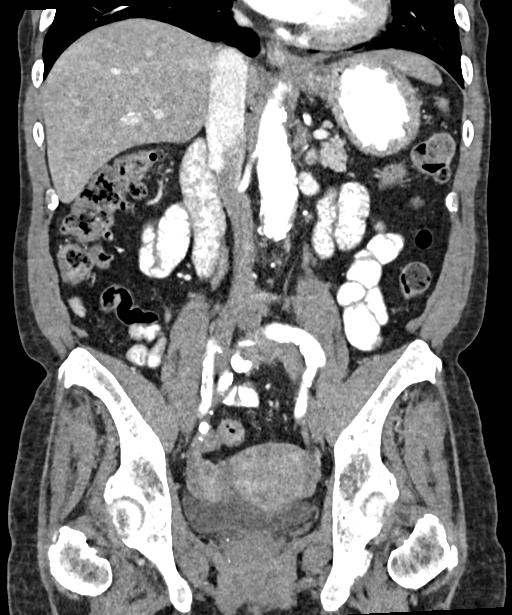

[14 of 46 positions shown; findings below may reference images not displayed]

FINDINGS: Lower chest: Lung bases are clear.

Hepatobiliary: No focal hepatic lesion. Postcholecystectomy. No
biliary duct dilatation.

Pancreas: Pancreas is normal. No ductal dilatation. No pancreatic
inflammation.

Spleen: Normal spleen

Adrenals/urinary tract: Thickened adrenal glands consistent with
hyperplasia. No focal renal lesion. Ureters and bladder normal.

Stomach/Bowel: Stomach, small bowel appendix and cecum normal.
Ascending, transverse and descending colon normal.

Vascular/Lymphatic: Abdominal aorta is normal caliber with
atherosclerotic calcification. There is no retroperitoneal or
periportal lymphadenopathy. No pelvic lymphadenopathy.

Reproductive: Lobular uterus consistent leiomyoma. No adnexal
abnormality.

Other: There is a rounded calcific body in the ventral peritoneal
space of the RIGHT abdomen measuring 2.7 x 2.4 cm. This is unchanged
from 5223 consistent with benign etiology.

Musculoskeletal: No aggressive osseous lesion.
IMPRESSION: 1. Benign rim calcified lesion in the RIGHT lower quadrant not
changed from 5223.
2. Fibroid uterus.
3.  Aortic Atherosclerosis ([id number]).
